# Patient Record
Sex: Female | Born: 1985 | Race: White | Hispanic: No | Marital: Married | Smoking: Never smoker
Health system: Southern US, Community
[De-identification: ages and names within clinical notes are randomized; demographics above are authoritative.]

## PROBLEM LIST (undated history)

## (undated) DIAGNOSIS — E0590 Thyrotoxicosis, unspecified without thyrotoxic crisis or storm: Secondary | ICD-10-CM

## (undated) HISTORY — PX: WISDOM TOOTH EXTRACTION: SHX21

---
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
## 2008-04-06 ENCOUNTER — Emergency Department (HOSPITAL_COMMUNITY): Admission: EM | Admit: 2008-04-06 | Discharge: 2008-04-06 | Payer: Self-pay | Admitting: Emergency Medicine

## 2011-08-29 LAB — BASIC METABOLIC PANEL
BUN: 8
CO2: 24
Calcium: 8.6
Chloride: 107
Creatinine, Ser: 0.7
GFR calc Af Amer: >60
GFR calc non Af Amer: >60
Glucose, Bld: 96
Potassium: 3.9
Sodium: 139

## 2011-08-29 LAB — CBC
HCT: 36.5
Hemoglobin: 12.3
MCHC: 33.6
MCV: 79.3
Platelets: 310
RBC: 4.61
RDW: 14.8
WBC: 14.5 — ABNORMAL HIGH

## 2011-08-29 LAB — DIFFERENTIAL
Basophils Absolute: 0
Basophils Relative: 0
Eosinophils Absolute: 0
Eosinophils Relative: 0
Lymphocytes Relative: 2 — ABNORMAL LOW
Lymphs Abs: 0.4 — ABNORMAL LOW
Monocytes Absolute: 0.3
Monocytes Relative: 2 — ABNORMAL LOW
Neutro Abs: 13.8 — ABNORMAL HIGH
Neutrophils Relative %: 95 — ABNORMAL HIGH

## 2011-08-29 LAB — URINE MICROSCOPIC-ADD ON

## 2011-08-29 LAB — URINALYSIS, ROUTINE W REFLEX MICROSCOPIC
Bilirubin Urine: NEGATIVE
Glucose, UA: NEGATIVE
Ketones, ur: NEGATIVE
Nitrite: NEGATIVE
Protein, ur: NEGATIVE
Specific Gravity, Urine: 1.014
Urobilinogen, UA: 0.2
pH: 6

## 2011-08-29 LAB — POCT PREGNANCY, URINE
Operator id: 288331
Preg Test, Ur: NEGATIVE

## 2014-03-07 LAB — OB RESULTS CONSOLE GC/CHLAMYDIA
CHLAMYDIA, DNA PROBE: NEGATIVE
GC PROBE AMP, GENITAL: NEGATIVE

## 2014-03-08 LAB — OB RESULTS CONSOLE HEPATITIS B SURFACE ANTIGEN: HEP B S AG: NEGATIVE

## 2014-03-08 LAB — OB RESULTS CONSOLE ABO/RH: RH Type: POSITIVE

## 2014-03-08 LAB — OB RESULTS CONSOLE HIV ANTIBODY (ROUTINE TESTING): HIV: NONREACTIVE

## 2014-03-08 LAB — OB RESULTS CONSOLE RUBELLA ANTIBODY, IGM: Rubella: NON-IMMUNE/NOT IMMUNE

## 2014-03-08 LAB — OB RESULTS CONSOLE ANTIBODY SCREEN: Antibody Screen: NEGATIVE

## 2014-03-08 LAB — OB RESULTS CONSOLE RPR: RPR: NONREACTIVE

## 2014-03-08 LAB — OB RESULTS CONSOLE GBS: STREP GROUP B AG: POSITIVE

## 2014-10-21 ENCOUNTER — Telehealth (HOSPITAL_COMMUNITY): Payer: Self-pay | Admitting: *Deleted

## 2014-10-21 ENCOUNTER — Encounter (HOSPITAL_COMMUNITY): Payer: Self-pay | Admitting: *Deleted

## 2014-10-21 NOTE — Telephone Encounter (Signed)
Preadmission screen  

## 2014-10-23 ENCOUNTER — Inpatient Hospital Stay (HOSPITAL_COMMUNITY)
Admission: AD | Admit: 2014-10-23 | Discharge: 2014-10-25 | DRG: 765 | Disposition: A | Discharge status: Home / Self Care | Payer: BC Managed Care – PPO | Source: Ambulatory Visit | Attending: Obstetrics and Gynecology | Admitting: Obstetrics and Gynecology

## 2014-10-23 ENCOUNTER — Encounter (HOSPITAL_COMMUNITY): Payer: Self-pay | Admitting: Anesthesiology

## 2014-10-23 ENCOUNTER — Inpatient Hospital Stay (HOSPITAL_COMMUNITY)
Admission: RE | Admit: 2014-10-23 | Discharge: 2014-10-23 | Disposition: A | Discharge status: Home / Self Care | Payer: BC Managed Care – PPO | Source: Ambulatory Visit | Attending: Obstetrics and Gynecology | Admitting: Obstetrics and Gynecology

## 2014-10-23 ENCOUNTER — Encounter (HOSPITAL_COMMUNITY): Payer: Self-pay | Admitting: *Deleted

## 2014-10-23 ENCOUNTER — Inpatient Hospital Stay (HOSPITAL_COMMUNITY): Payer: BC Managed Care – PPO | Admitting: Anesthesiology

## 2014-10-23 ENCOUNTER — Encounter (HOSPITAL_COMMUNITY)
Admission: AD | Disposition: A | Discharge status: Home / Self Care | Payer: Self-pay | Source: Ambulatory Visit | Attending: Obstetrics and Gynecology

## 2014-10-23 DIAGNOSIS — E0590 Thyrotoxicosis, unspecified without thyrotoxic crisis or storm: Secondary | ICD-10-CM | POA: Diagnosis present

## 2014-10-23 DIAGNOSIS — D62 Acute posthemorrhagic anemia: Secondary | ICD-10-CM | POA: Diagnosis not present

## 2014-10-23 DIAGNOSIS — Z880 Allergy status to penicillin: Secondary | ICD-10-CM | POA: Diagnosis not present

## 2014-10-23 DIAGNOSIS — O480 Post-term pregnancy: Secondary | ICD-10-CM | POA: Diagnosis present

## 2014-10-23 DIAGNOSIS — O99824 Streptococcus B carrier state complicating childbirth: Secondary | ICD-10-CM | POA: Diagnosis present

## 2014-10-23 DIAGNOSIS — O99284 Endocrine, nutritional and metabolic diseases complicating childbirth: Secondary | ICD-10-CM | POA: Diagnosis present

## 2014-10-23 DIAGNOSIS — Z3A41 41 weeks gestation of pregnancy: Secondary | ICD-10-CM | POA: Diagnosis present

## 2014-10-23 DIAGNOSIS — O9081 Anemia of the puerperium: Secondary | ICD-10-CM | POA: Diagnosis not present

## 2014-10-23 HISTORY — DX: Thyrotoxicosis, unspecified without thyrotoxic crisis or storm: E05.90

## 2014-10-23 LAB — CBC
HEMATOCRIT: 35.8 % — AB (ref 36.0–46.0)
HEMOGLOBIN: 12.2 g/dL (ref 12.0–15.0)
MCH: 28.4 pg (ref 26.0–34.0)
MCHC: 34.1 g/dL (ref 30.0–36.0)
MCV: 83.3 fL (ref 78.0–100.0)
Platelets: 242 10*3/uL (ref 150–400)
RBC: 4.3 MIL/uL (ref 3.87–5.11)
RDW: 14.4 % (ref 11.5–15.5)
WBC: 15.5 10*3/uL — ABNORMAL HIGH (ref 4.0–10.5)

## 2014-10-23 LAB — RPR

## 2014-10-23 LAB — PREPARE RBC (CROSSMATCH)

## 2014-10-23 LAB — ABO/RH: ABO/RH(D): O POS

## 2014-10-23 SURGERY — Surgical Case
Anesthesia: Epidural

## 2014-10-23 MED ORDER — LACTATED RINGERS IV BOLUS (SEPSIS)
1000.0000 mL | Freq: Once | INTRAVENOUS | Status: AC
  Administered 2014-10-23: 1000 mL via INTRAVENOUS

## 2014-10-23 MED ORDER — DIPHENHYDRAMINE HCL 50 MG/ML IJ SOLN: 12.5000 mg | INTRAMUSCULAR | Status: DC | PRN

## 2014-10-23 MED ORDER — OXYTOCIN 40 UNITS IN LACTATED RINGERS INFUSION - SIMPLE MED
62.5000 mL/h | INTRAVENOUS | Status: DC
Start: 2014-10-23 — End: 2014-10-23
  Filled 2014-10-23: qty 1000

## 2014-10-23 MED ORDER — MEPERIDINE HCL 25 MG/ML IJ SOLN: 6.2500 mg | INTRAMUSCULAR | Status: DC | PRN

## 2014-10-23 MED ORDER — SIMETHICONE 80 MG PO CHEW
80.0000 mg | CHEWABLE_TABLET | ORAL | Status: DC
  Administered 2014-10-24 (×2): 80 mg via ORAL
  Filled 2014-10-23 (×2): qty 1

## 2014-10-23 MED ORDER — LANOLIN HYDROUS EX OINT: 1.0000 "application " | TOPICAL_OINTMENT | CUTANEOUS | Status: DC | PRN

## 2014-10-23 MED ORDER — SENNOSIDES-DOCUSATE SODIUM 8.6-50 MG PO TABS
2.0000 | ORAL_TABLET | ORAL | Status: DC
  Administered 2014-10-24: 2 via ORAL
  Filled 2014-10-23 (×2): qty 2

## 2014-10-23 MED ORDER — NALOXONE HCL 0.4 MG/ML IJ SOLN: 0.4000 mg | INTRAMUSCULAR | Status: DC | PRN

## 2014-10-23 MED ORDER — EPHEDRINE 5 MG/ML INJ: 10.0000 mg | INTRAVENOUS | Status: DC | PRN

## 2014-10-23 MED ORDER — TERBUTALINE SULFATE 1 MG/ML IJ SOLN
INTRAMUSCULAR | Status: AC
  Administered 2014-10-23: 0.25 mg via SUBCUTANEOUS
  Filled 2014-10-23: qty 1

## 2014-10-23 MED ORDER — DEXTROSE 5 % IV SOLN
Freq: Once | INTRAVENOUS | Status: AC
  Administered 2014-10-23: 100 mL via INTRAVENOUS
  Filled 2014-10-23: qty 8.25

## 2014-10-23 MED ORDER — FENTANYL 2.5 MCG/ML BUPIVACAINE 1/10 % EPIDURAL INFUSION (WH - ANES)
INTRAMUSCULAR | Status: AC
  Filled 2014-10-23: qty 125

## 2014-10-23 MED ORDER — ZOLPIDEM TARTRATE 5 MG PO TABS: 5.0000 mg | ORAL_TABLET | Freq: Every evening | ORAL | Status: DC | PRN

## 2014-10-23 MED ORDER — PHENYLEPHRINE 40 MCG/ML (10ML) SYRINGE FOR IV PUSH (FOR BLOOD PRESSURE SUPPORT)
PREFILLED_SYRINGE | INTRAVENOUS | Status: AC
  Filled 2014-10-23: qty 10

## 2014-10-23 MED ORDER — DIPHENHYDRAMINE HCL 25 MG PO CAPS: 25.0000 mg | ORAL_CAPSULE | ORAL | Status: DC | PRN

## 2014-10-23 MED ORDER — IBUPROFEN 600 MG PO TABS
600.0000 mg | ORAL_TABLET | Freq: Four times a day (QID) | ORAL | Status: DC
  Administered 2014-10-24 – 2014-10-25 (×8): 600 mg via ORAL
  Filled 2014-10-23 (×8): qty 1

## 2014-10-23 MED ORDER — ONDANSETRON HCL 4 MG/2ML IJ SOLN
4.0000 mg | INTRAMUSCULAR | Status: DC | PRN
Start: 2014-10-23 — End: 2014-10-24

## 2014-10-23 MED ORDER — NALBUPHINE HCL 10 MG/ML IJ SOLN: 5.0000 mg | INTRAMUSCULAR | Status: DC | PRN

## 2014-10-23 MED ORDER — ONDANSETRON HCL 4 MG/2ML IJ SOLN
INTRAMUSCULAR | Status: AC
  Filled 2014-10-23: qty 2

## 2014-10-23 MED ORDER — METHYLERGONOVINE MALEATE 0.2 MG PO TABS: 0.2000 mg | ORAL_TABLET | ORAL | Status: DC | PRN

## 2014-10-23 MED ORDER — ONDANSETRON HCL 4 MG PO TABS: 4.0000 mg | ORAL_TABLET | ORAL | Status: DC | PRN

## 2014-10-23 MED ORDER — DEXTROSE IN LACTATED RINGERS 5 % IV SOLN: INTRAVENOUS | Status: DC

## 2014-10-23 MED ORDER — SODIUM BICARBONATE 8.4 % IV SOLN
INTRAVENOUS | Status: DC | PRN
  Administered 2014-10-23 (×3): 5 mL via EPIDURAL

## 2014-10-23 MED ORDER — SODIUM CHLORIDE 0.9 % IJ SOLN: 3.0000 mL | INTRAMUSCULAR | Status: DC | PRN

## 2014-10-23 MED ORDER — MORPHINE SULFATE (PF) 0.5 MG/ML IJ SOLN
INTRAMUSCULAR | Status: DC | PRN
  Administered 2014-10-23: 3 mg via EPIDURAL

## 2014-10-23 MED ORDER — KETOROLAC TROMETHAMINE 30 MG/ML IJ SOLN
30.0000 mg | Freq: Four times a day (QID) | INTRAMUSCULAR | Status: DC | PRN
  Administered 2014-10-23 (×2): 30 mg via INTRAVENOUS
  Filled 2014-10-23: qty 1

## 2014-10-23 MED ORDER — LACTATED RINGERS IV SOLN
INTRAVENOUS | Status: DC
  Administered 2014-10-23 (×3): via INTRAUTERINE

## 2014-10-23 MED ORDER — DIPHENHYDRAMINE HCL 25 MG PO CAPS: 25.0000 mg | ORAL_CAPSULE | Freq: Four times a day (QID) | ORAL | Status: DC | PRN

## 2014-10-23 MED ORDER — OXYTOCIN BOLUS FROM INFUSION
500.0000 mL | INTRAVENOUS | Status: DC
Start: 2014-10-23 — End: 2014-10-23

## 2014-10-23 MED ORDER — PHENYLEPHRINE 40 MCG/ML (10ML) SYRINGE FOR IV PUSH (FOR BLOOD PRESSURE SUPPORT): 80.0000 ug | PREFILLED_SYRINGE | INTRAVENOUS | Status: DC | PRN

## 2014-10-23 MED ORDER — TERBUTALINE SULFATE 1 MG/ML IJ SOLN
0.2500 mg | INTRAMUSCULAR | Status: AC
  Administered 2014-10-23: 0.25 mg via SUBCUTANEOUS

## 2014-10-23 MED ORDER — OXYCODONE-ACETAMINOPHEN 5-325 MG PO TABS: 2.0000 | ORAL_TABLET | ORAL | Status: DC | PRN

## 2014-10-23 MED ORDER — OXYTOCIN 40 UNITS IN LACTATED RINGERS INFUSION - SIMPLE MED: 62.5000 mL/h | INTRAVENOUS | Status: DC

## 2014-10-23 MED ORDER — PHENYLEPHRINE HCL 10 MG/ML IJ SOLN
INTRAMUSCULAR | Status: DC | PRN
  Administered 2014-10-23 (×8): 40 ug via INTRAVENOUS

## 2014-10-23 MED ORDER — FENTANYL 2.5 MCG/ML BUPIVACAINE 1/10 % EPIDURAL INFUSION (WH - ANES)
INTRAMUSCULAR | Status: DC | PRN
  Administered 2014-10-23: 14 mL/h via EPIDURAL

## 2014-10-23 MED ORDER — FLEET ENEMA 7-19 GM/118ML RE ENEM: 1.0000 | ENEMA | Freq: Every day | RECTAL | Status: DC | PRN

## 2014-10-23 MED ORDER — METHYLERGONOVINE MALEATE 0.2 MG/ML IJ SOLN: 0.2000 mg | INTRAMUSCULAR | Status: DC | PRN

## 2014-10-23 MED ORDER — SCOPOLAMINE 1 MG/3DAYS TD PT72
MEDICATED_PATCH | TRANSDERMAL | Status: AC
  Filled 2014-10-23: qty 1

## 2014-10-23 MED ORDER — BISACODYL 10 MG RE SUPP: 10.0000 mg | Freq: Every day | RECTAL | Status: DC | PRN

## 2014-10-23 MED ORDER — LIDOCAINE HCL (PF) 1 % IJ SOLN
30.0000 mL | INTRAMUSCULAR | Status: DC | PRN
  Filled 2014-10-23: qty 30

## 2014-10-23 MED ORDER — OXYTOCIN 10 UNIT/ML IJ SOLN
INTRAMUSCULAR | Status: AC
  Filled 2014-10-23: qty 4

## 2014-10-23 MED ORDER — OXYTOCIN 10 UNIT/ML IJ SOLN
40.0000 [IU] | INTRAVENOUS | Status: DC | PRN
  Administered 2014-10-23: 40 [IU] via INTRAVENOUS

## 2014-10-23 MED ORDER — BUPIVACAINE HCL (PF) 0.25 % IJ SOLN
INTRAMUSCULAR | Status: AC
  Filled 2014-10-23: qty 10

## 2014-10-23 MED ORDER — NALBUPHINE HCL 10 MG/ML IJ SOLN: 5.0000 mg | Freq: Once | INTRAMUSCULAR | Status: AC | PRN

## 2014-10-23 MED ORDER — FERROUS SULFATE 325 (65 FE) MG PO TABS
325.0000 mg | ORAL_TABLET | Freq: Two times a day (BID) | ORAL | Status: DC
  Administered 2014-10-23 – 2014-10-25 (×5): 325 mg via ORAL
  Filled 2014-10-23 (×5): qty 1

## 2014-10-23 MED ORDER — ONDANSETRON HCL 4 MG/2ML IJ SOLN: 4.0000 mg | Freq: Four times a day (QID) | INTRAMUSCULAR | Status: DC | PRN

## 2014-10-23 MED ORDER — SIMETHICONE 80 MG PO CHEW
80.0000 mg | CHEWABLE_TABLET | Freq: Three times a day (TID) | ORAL | Status: DC
  Administered 2014-10-23 – 2014-10-25 (×6): 80 mg via ORAL
  Filled 2014-10-23 (×7): qty 1

## 2014-10-23 MED ORDER — LACTATED RINGERS IV SOLN: 500.0000 mL | INTRAVENOUS | Status: DC | PRN

## 2014-10-23 MED ORDER — DEXTROSE 5 % IV SOLN
1.0000 ug/kg/h | INTRAVENOUS | Status: DC | PRN
  Filled 2014-10-23: qty 2

## 2014-10-23 MED ORDER — OXYTOCIN 10 UNIT/ML IJ SOLN
10.0000 [IU] | Freq: Once | INTRAMUSCULAR | Status: DC
  Filled 2014-10-23: qty 1

## 2014-10-23 MED ORDER — CLINDAMYCIN PHOSPHATE 900 MG/50ML IV SOLN
900.0000 mg | Freq: Three times a day (TID) | INTRAVENOUS | Status: DC
  Administered 2014-10-23: 900 mg via INTRAVENOUS
  Filled 2014-10-23 (×2): qty 50

## 2014-10-23 MED ORDER — WITCH HAZEL-GLYCERIN EX PADS: 1.0000 "application " | MEDICATED_PAD | CUTANEOUS | Status: DC | PRN

## 2014-10-23 MED ORDER — FENTANYL 2.5 MCG/ML BUPIVACAINE 1/10 % EPIDURAL INFUSION (WH - ANES)
14.0000 mL/h | INTRAMUSCULAR | Status: DC | PRN
  Administered 2014-10-23: 14 mL/h via EPIDURAL

## 2014-10-23 MED ORDER — BUPIVACAINE HCL (PF) 0.25 % IJ SOLN
INTRAMUSCULAR | Status: DC | PRN
  Administered 2014-10-23: 10 mL

## 2014-10-23 MED ORDER — OXYCODONE-ACETAMINOPHEN 5-325 MG PO TABS
1.0000 | ORAL_TABLET | ORAL | Status: DC | PRN
Start: 2014-10-23 — End: 2014-10-23

## 2014-10-23 MED ORDER — MORPHINE SULFATE 0.5 MG/ML IJ SOLN
INTRAMUSCULAR | Status: AC
  Filled 2014-10-23: qty 10

## 2014-10-23 MED ORDER — PRENATAL MULTIVITAMIN CH
1.0000 | ORAL_TABLET | Freq: Every day | ORAL | Status: DC
  Administered 2014-10-24 – 2014-10-25 (×2): 1 via ORAL
  Filled 2014-10-23 (×2): qty 1

## 2014-10-23 MED ORDER — KETOROLAC TROMETHAMINE 30 MG/ML IJ SOLN
INTRAMUSCULAR | Status: AC
  Administered 2014-10-23: 30 mg via INTRAVENOUS
  Filled 2014-10-23: qty 1

## 2014-10-23 MED ORDER — SODIUM CHLORIDE 0.9 % IV SOLN: 250.0000 mL | INTRAVENOUS | Status: DC

## 2014-10-23 MED ORDER — LIDOCAINE HCL (PF) 1 % IJ SOLN
INTRAMUSCULAR | Status: DC | PRN
  Administered 2014-10-23 (×2): 4 mL

## 2014-10-23 MED ORDER — LACTATED RINGERS IV SOLN
500.0000 mL | Freq: Once | INTRAVENOUS | Status: AC
  Administered 2014-10-23: 500 mL via INTRAVENOUS

## 2014-10-23 MED ORDER — MENTHOL 3 MG MT LOZG
1.0000 | LOZENGE | OROMUCOSAL | Status: DC | PRN
  Filled 2014-10-23: qty 9

## 2014-10-23 MED ORDER — SCOPOLAMINE 1 MG/3DAYS TD PT72
1.0000 | MEDICATED_PATCH | Freq: Once | TRANSDERMAL | Status: DC
  Administered 2014-10-23: 1.5 mg via TRANSDERMAL

## 2014-10-23 MED ORDER — SODIUM CHLORIDE 0.9 % IJ SOLN: 3.0000 mL | Freq: Two times a day (BID) | INTRAMUSCULAR | Status: DC

## 2014-10-23 MED ORDER — DIBUCAINE 1 % RE OINT: 1.0000 "application " | TOPICAL_OINTMENT | RECTAL | Status: DC | PRN

## 2014-10-23 MED ORDER — SIMETHICONE 80 MG PO CHEW: 80.0000 mg | CHEWABLE_TABLET | ORAL | Status: DC | PRN

## 2014-10-23 MED ORDER — ONDANSETRON HCL 4 MG/2ML IJ SOLN: 4.0000 mg | Freq: Three times a day (TID) | INTRAMUSCULAR | Status: DC | PRN

## 2014-10-23 MED ORDER — OXYCODONE-ACETAMINOPHEN 5-325 MG PO TABS: 1.0000 | ORAL_TABLET | ORAL | Status: DC | PRN

## 2014-10-23 MED ORDER — CITRIC ACID-SODIUM CITRATE 334-500 MG/5ML PO SOLN
30.0000 mL | ORAL | Status: DC | PRN
  Administered 2014-10-23: 30 mL via ORAL
  Filled 2014-10-23: qty 15

## 2014-10-23 MED ORDER — ONDANSETRON HCL 4 MG/2ML IJ SOLN
INTRAMUSCULAR | Status: DC | PRN
  Administered 2014-10-23: 4 mg via INTRAVENOUS

## 2014-10-23 MED ORDER — KETOROLAC TROMETHAMINE 30 MG/ML IJ SOLN: 30.0000 mg | Freq: Four times a day (QID) | INTRAMUSCULAR | Status: DC | PRN

## 2014-10-23 SURGICAL SUPPLY — 41 items
APL SKNCLS STERI-STRIP NONHPOA (GAUZE/BANDAGES/DRESSINGS)
BARRIER ADHS 3X4 INTERCEED (GAUZE/BANDAGES/DRESSINGS) ×2 IMPLANT
BENZOIN TINCTURE PRP APPL 2/3 (GAUZE/BANDAGES/DRESSINGS) IMPLANT
BRR ADH 4X3 ABS CNTRL BYND (GAUZE/BANDAGES/DRESSINGS) ×1
CLAMP CORD UMBIL (MISCELLANEOUS) IMPLANT
CLOTH BEACON ORANGE TIMEOUT ST (SAFETY) ×2 IMPLANT
CONTAINER PREFILL 10% NBF 15ML (MISCELLANEOUS) IMPLANT
DRAPE SHEET LG 3/4 BI-LAMINATE (DRAPES) IMPLANT
DRSG OPSITE POSTOP 4X10 (GAUZE/BANDAGES/DRESSINGS) ×2 IMPLANT
DURAPREP 26ML APPLICATOR (WOUND CARE) ×2 IMPLANT
ELECT REM PT RETURN 9FT ADLT (ELECTROSURGICAL) ×2
ELECTRODE REM PT RTRN 9FT ADLT (ELECTROSURGICAL) ×1 IMPLANT
EXTRACTOR VACUUM M CUP 4 TUBE (SUCTIONS) IMPLANT
GLOVE BIOGEL PI IND STRL 7.0 (GLOVE) ×1 IMPLANT
GLOVE BIOGEL PI INDICATOR 7.0 (GLOVE) ×1
GLOVE ECLIPSE 6.5 STRL STRAW (GLOVE) ×2 IMPLANT
GOWN STRL REUS W/TWL LRG LVL3 (GOWN DISPOSABLE) ×4 IMPLANT
KIT ABG SYR 3ML LUER SLIP (SYRINGE) IMPLANT
NEEDLE HYPO 25X1 1.5 SAFETY (NEEDLE) ×2 IMPLANT
NEEDLE HYPO 25X5/8 SAFETYGLIDE (NEEDLE) IMPLANT
NS IRRIG 1000ML POUR BTL (IV SOLUTION) ×2 IMPLANT
PACK C SECTION WH (CUSTOM PROCEDURE TRAY) ×2 IMPLANT
PAD OB MATERNITY 4.3X12.25 (PERSONAL CARE ITEMS) ×2 IMPLANT
RTRCTR C-SECT PINK 25CM LRG (MISCELLANEOUS) IMPLANT
STAPLER VISISTAT 35W (STAPLE) IMPLANT
STRIP CLOSURE SKIN 1/2X4 (GAUZE/BANDAGES/DRESSINGS) IMPLANT
SUT CHROMIC GUT AB #0 18 (SUTURE) IMPLANT
SUT MNCRL 0 VIOLET CTX 36 (SUTURE) ×3 IMPLANT
SUT MON AB 4-0 PS1 27 (SUTURE) IMPLANT
SUT MONOCRYL 0 CTX 36 (SUTURE) ×3
SUT PLAIN 2 0 (SUTURE)
SUT PLAIN 2 0 XLH (SUTURE) IMPLANT
SUT PLAIN ABS 2-0 CT1 27XMFL (SUTURE) IMPLANT
SUT VIC AB 0 CT1 36 (SUTURE) ×4 IMPLANT
SUT VIC AB 2-0 CT1 27 (SUTURE) ×2
SUT VIC AB 2-0 CT1 TAPERPNT 27 (SUTURE) ×1 IMPLANT
SUT VIC AB 4-0 PS2 27 (SUTURE) IMPLANT
SYR CONTROL 10ML LL (SYRINGE) ×2 IMPLANT
TOWEL OR 17X24 6PK STRL BLUE (TOWEL DISPOSABLE) ×2 IMPLANT
TRAY FOLEY CATH 14FR (SET/KITS/TRAYS/PACK) IMPLANT
WATER STERILE IRR 1000ML POUR (IV SOLUTION) ×2 IMPLANT

## 2014-10-23 NOTE — Progress Notes (Signed)
Persistent deep variables. Cervix unchanged. Unable to do pitocin Pt advised of need for C/S.  terbutaline ordered. OR who was on standby notified of need for urgent C/S.

## 2014-10-23 NOTE — Progress Notes (Signed)
Subjective:   Comfortable with epidural.  Objective:   VS: Blood pressure 123/71, pulse 80, temperature 97.8 F (36.6 C), temperature source Oral, resp. rate 18, height 5\' 2"  (1.575 m), weight 88.905 kg (196 lb), last menstrual period 01/05/2014. FHR: baseline 145 / variability mod / accelerations absent / variable decelerations Toco: contractions every 2-3 minutes Cervix: Dilation: 6.5 Effacement (%): 80 Station: -1 Exam by:: Fabian NovemberM. Lateisha Thurlow, CNM ROT Membranes: mod mec, good return from amnioinfusion  Assessment:  Labor: active FHR category II  Plan:  Initial improvement of variables after amnioinfusion, now with recurrent deep variables and transverse fetal vtx. Left lateral and peanut ball to facilitate rotation. Dr. Cherly Hensenousins at bedside. Likely need for CS.     Donette LarryBHAMBRI, Jericho Cieslik, N MSN, CNM 10/23/2014, 7:07 AM

## 2014-10-23 NOTE — Addendum Note (Signed)
Addendum  created 10/23/14 1040 by Corky Soxhris Shatiqua Heroux, MD   Modules edited: Orders, PRL Based Order Sets

## 2014-10-23 NOTE — Anesthesia Procedure Notes (Signed)
Epidural Patient location during procedure: OB  Staffing Anesthesiologist: Lewie LoronGERMEROTH, Chelisa Hennen R Performed by: anesthesiologist   Preanesthetic Checklist Completed: patient identified, pre-op evaluation, timeout performed, IV checked, risks and benefits discussed and monitors and equipment checked  Epidural Patient position: sitting Prep: site prepped and draped and DuraPrep Patient monitoring: heart rate Approach: midline Location: L3-L4 Injection technique: LOR air and LOR saline  Needle:  Needle type: Tuohy  Needle gauge: 17 G Needle length: 9 cm Needle insertion depth: 6 cm Catheter type: closed end flexible Catheter size: 19 Gauge Catheter at skin depth: 11 cm Test dose: negative  Assessment Sensory level: T8 Events: blood not aspirated, injection not painful, no injection resistance, negative IV test and no paresthesia  Additional Notes Reason for block:procedure for pain

## 2014-10-23 NOTE — Anesthesia Postprocedure Evaluation (Signed)
  Anesthesia Post-op Note  Patient: Sara Wright C Mangini  Procedure(s) Performed: Procedure(s): CESAREAN SECTION (N/A)  Patient Location: Mother/Baby  Anesthesia Type:Epidural  Level of Consciousness: awake  Airway and Oxygen Therapy: Patient Spontanous Breathing  Post-op Pain: mild  Post-op Assessment: Patient's Cardiovascular Status Stable and Respiratory Function Stable  Post-op Vital Signs: stable  Last Vitals:  Filed Vitals:   10/23/14 1438  BP: 121/66  Pulse: 92  Temp: 37 C  Resp: 20    Complications: No apparent anesthesia complications

## 2014-10-23 NOTE — Op Note (Deleted)
NAME:  Sara Wright, [name]                 ACCOUNT NO.:  [id number]  MEDICAL RECORD NO.:  [id number]  LOCATION:  [facility]                        FACILITY:  [facility]  PHYSICIAN:  Maxie BetterSheronette Azlynn Mitnick, M.D.DATE OF BIRTH:  1986/10/05  DATE OF PROCEDURE:  10/23/2014 DATE OF DISCHARGE:                              OPERATIVE REPORT    PREOPERATIVE DIAGNOSIS:  Repetitive deep variable deceleration, postdates.  POSTOPERATIVE DIAGNOSIS:  Repetitive deep variable decelerations, postdates.  PROCEDURE:  Urgent primary cesarean section, Kerr hysterotomy.  ANESTHESIA:  Epidural.  SURGEON:  [name], MD  ASSISTANT:  Donette LarryMelanie Bhambri, CNM  DESCRIPTION OF PROCEDURE:  Under adequate epidural anesthesia, the patient was placed in the supine position with left lateral tilt.  She was sterilely prepped and draped in usual fashion.  Indwelling Foley catheter was already in place.  0.25% Marcaine was injected along the planned Pfannenstiel skin incision site.  Pfannenstiel skin incision was then made, carried down to the rectus fascia.  Rectus fascia was opened transversely.  Rectus fascia was then bluntly and sharply dissected off the rectus muscle in superior and inferior fashion.  The rectus muscle was split in midline.  The parietal peritoneum was entered bluntly and extended.  The Alexis retractor was then placed.  The vesicouterine peritoneum was opened transversely.  The bladder was then bluntly and sharply dissected off the lower uterine segment and displaced inferiorly with a bladder retractor.  A curvilinear low transverse uterine incision was then made and extended with bandage scissors.  Subsequent delivery of a live female with cord around the neck was delivered from the left occiput transverse position.  Baby was bulb suctioned in the abdomen. Cord was reduced.  There was also cord on the left arm.  The cord was then clamped and cut.  The baby was transferred to the awaiting pediatrician  who assigned Apgars of 8 and 9 at 1 and 5 minutes.  The placenta was manually removed and sent to Pathology.  Uterine cavity was cleaned of debris.  Uterine incision had no extension, was closed in 2 layers, the first layer with 0 Monocryl running locked stitch and second layer was imbricated using 0 Monocryl suture.  Good hemostasis was achieved.  Normal tubes and ovaries were noted bilaterally.  The abdomen was irrigated and suctioned.  The Alexis retractor removed.  Interceed was then placed in the lower uterine segment.  The parietal peritoneum was then closed with 2-0 Vicryl.  The rectus fascia was closed with 0 Vicryl x2.  The subcutaneous area was irrigated, small bleeders cauterized.  Interrupted 2-0 plain sutures placed and the skin approximated using Ethicon staples.  Specimen was placenta sent to Pathology.  Cord pH was obtained.  ESTIMATED BLOOD LOSS:  500 mL.  INTRAOPERATIVE FLUID:  2300 mL.  URINE OUTPUT:  100 mL clear yellow urine.  COUNTS:  Sponge and instrument counts x2 were correct.  COMPLICATION:  None.  Weight of the baby was 8 pounds 8 ounces.  The baby was placed skin-to- skin in the operating room.  The patient was transferred to recovery in stable condition.     [name], M.D.     [place]/MEDQ  D:  10/23/2014  T:  10/23/2014  Job:  [id number]

## 2014-10-23 NOTE — Transfer of Care (Signed)
Immediate Anesthesia Transfer of Care Note  Patient: Sara Wright  Procedure(s) Performed: Procedure(s): CESAREAN SECTION (N/A)  Patient Location: PACU  Anesthesia Type:Epidural  Level of Consciousness: awake, alert  and oriented  Airway & Oxygen Therapy: Patient Spontanous Breathing  Post-op Assessment: Report given to PACU RN and Post -op Vital signs reviewed and stable  Post vital signs: Reviewed and stable  Complications: No apparent anesthesia complications

## 2014-10-23 NOTE — Plan of Care (Signed)
Problem: Phase I Progression Outcomes Goal: Foley catheter patent Outcome: Completed/Met Date Met:  10/23/14

## 2014-10-23 NOTE — Progress Notes (Signed)
Subjective:   Breathing with ctx, left lateral position. Ready for epidural.  Objective:   VS: Blood pressure 123/71, pulse 80, temperature 97.8 F (36.6 C), temperature source Oral, resp. rate 18, height 5\' 2"  (1.575 m), weight 88.905 kg (196 lb), last menstrual period 01/05/2014. FHR: baseline 130 / variability mod / accelerations absent / variable decelerations-recurrent, nadir to 80-90s Toco: contractions every 2-5 minutes Cervix: Dilation: 6.5 Effacement (%): 80 Station: -1 Exam by:: Fabian NovemberM. Coti Burd, CNM Membranes: BBOW  Assessment:  Labor: active FHR category II  Plan:  Variables non-responsive to repositioning and o2. Plan AROM, start amnioinfusion, guarded for SVD. Dr. Cherly Hensenousins updated, enroute to hospital.     Donette LarryBHAMBRI, Devynn Scheff, N MSN, CNM 10/23/2014, 6:29 AM

## 2014-10-23 NOTE — Anesthesia Preprocedure Evaluation (Signed)
Anesthesia Evaluation  Patient identified by MRN, date of birth, ID band Patient awake    Reviewed: Allergy & Precautions, H&P , NPO status , Patient's Chart, lab work & pertinent test results  Airway Mallampati: II  TM Distance: >3 FB Neck ROM: Full    Dental no notable dental hx.    Pulmonary neg pulmonary ROS,  breath sounds clear to auscultation  Pulmonary exam normal       Cardiovascular negative cardio ROS  Rhythm:Regular Rate:Normal     Neuro/Psych negative neurological ROS  negative psych ROS   GI/Hepatic negative GI ROS, Neg liver ROS,   Endo/Other  Hyperthyroidism   Renal/GU negative Renal ROS     Musculoskeletal negative musculoskeletal ROS (+)   Abdominal (+) + obese,   Peds  Hematology negative hematology ROS (+)   Anesthesia Other Findings   Reproductive/Obstetrics (+) Pregnancy                             Anesthesia Physical Anesthesia Plan  ASA: II  Anesthesia Plan: Epidural   Post-op Pain Management:    Induction:   Airway Management Planned:   Additional Equipment:   Intra-op Plan:   Post-operative Plan:   Informed Consent: I have reviewed the patients History and Physical, chart, labs and discussed the procedure including the risks, benefits and alternatives for the proposed anesthesia with the patient or authorized representative who has indicated his/her understanding and acceptance.     Plan Discussed with:   Anesthesia Plan Comments:         Anesthesia Quick Evaluation

## 2014-10-23 NOTE — Progress Notes (Addendum)
Called by CNM  For  Deep variable deceleration with  Each ctx  Instructed to Arom, IUPC with  Amnioinfusion and get epidural  ON arrival, Pt was in the process of getting epidural VE: per CNM 6 cm/80 /-1 ROT AROM was done: mod meconium fluid Amnioinfusion ongoing  Tracing reviewed: baseline 130's (+) mod to deep variables with some improvement after infusion Bolus. Ctx q 2-743mins  IMP: active phase with cord compression Postdates P) exaggerated sims position with peanut. Allow for amnioinfusion. Consented for poss C/S( risk reviewed and consent signed). ISE placement. Reviewed management with pt and husband . Agree with plan. OR notified to be on standby for  Possible C/S

## 2014-10-23 NOTE — Plan of Care (Signed)
Problem: Phase I Progression Outcomes Goal: Assess per MD/Nurse,Routine-VS,FHR,UC,Head to Toe assess Outcome: Completed/Met Date Met:  10/23/14 Goal: Obtain and review prenatal records Outcome: Completed/Met Date Met:  10/23/14 Goal: Pain controlled with appropriate interventions Outcome: Completed/Met Date Met:  10/23/14 Goal: OOB as tolerated unless otherwise ordered Outcome: Completed/Met Date Met:  10/23/14 Goal: Tolerating diet Outcome: Completed/Met Date Met:  10/23/14 Goal: Adequate progression of labor Outcome: Completed/Met Date Met:  10/23/14 Goal: Medications/IV Fluids N/A Outcome: Completed/Met Date Met:  10/23/14 Goal: Induction meds as ordered Outcome: Not Applicable Date Met:  56/25/63 Goal: IV Pain medications as ordered Outcome: Not Applicable Date Met:  89/37/34 Goal: Pitocin as ordered Outcome: Not Applicable Date Met:  28/76/81 Goal: FHR checked 5 minutes after meds (ROM) Rupture of Membranes Outcome: Completed/Met Date Met:  10/23/14 Goal: Assess/evaluate labor progress and adequacy Outcome: Completed/Met Date Met:  10/23/14 Goal: Assess/evaluate cervical exam prn (q2hrs in active phase) Outcome: Completed/Met Date Met:  10/23/14 Goal: Assess/evaluate notify MD when complete/8 cm Outcome: Not Applicable Date Met:  15/72/62 Fetal intolerance to labor:urgent c-section. Goal: Assess/evaluate effectiveness of pushing Outcome: Not Applicable Date Met:  03/55/97 Fetal intolerance;urgent c-section. Goal: Complete instrument count Outcome: Not Applicable Date Met:  41/63/84 Goal: Count of instrument items (Specify in a note) Outcome: Not Applicable Date Met:  53/64/68 Goal: Discharge home if all goals are met Outcome: Not Applicable Date Met:  02/20/21 Goal: Appropriate patient level of comfort Outcome: Not Applicable Date Met:  48/25/00 Goal: Medical plan of care initiated within 2 hrs of admission Outcome: Completed/Met Date Met:  10/23/14 Goal: Other Phase I  Outcomes/Goals Outcome: Completed/Met Date Met:  10/23/14  Problem: Phase II Progression Outcomes Goal: Fetal monitoring per orders Outcome: Completed/Met Date Met:  10/23/14 Goal: Key steps for effective pushing & educate pt/family Fetal intolerance :urgetn c-section.

## 2014-10-23 NOTE — Progress Notes (Signed)
ANTIBIOTIC CONSULT NOTE - INITIAL  Pharmacy Consult for Gentamicin Indication: Surgical prophylaxis  Allergies  Allergen Reactions  . Penicillins     Patient Measurements: Height: 5\' 2"  (157.5 cm) Weight: 196 lb (88.905 kg) IBW/kg (Calculated) : 50.1 Adjusted Body Weight: 66 kg  Vital Signs: Temp: 97.8 F (36.6 C) (11/21 0530) Temp Source: Oral (11/21 0530) BP: 111/63 mmHg (11/21 0707) Pulse Rate: 98 (11/21 0707) Intake/Output from previous day:   Intake/Output from this shift:    Labs:  Recent Labs  10/23/14 0530  WBC 15.5*  HGB 12.2  PLT 242   Estimated Creatinine Clearance: 108.4 mL/min (by C-G formula based on Cr of 0.7). No results for input(s): VANCOTROUGH, VANCOPEAK, VANCORANDOM, GENTTROUGH, GENTPEAK, GENTRANDOM, TOBRATROUGH, TOBRAPEAK, TOBRARND, AMIKACINPEAK, AMIKACINTROU, AMIKACIN in the last 72 hours.   Microbiology: No results found for this or any previous visit (from the past 720 hour(s)).  Medical History: Past Medical History  Diagnosis Date  . Hyperthyroidism     Labs WNL during pregnancy; no meds    Medications:   Assessment: 28 yo at term with orders for gentamicin and clindamycin for emergent C-section.  Goal of Therapy:  Appropriate surgical prophylaxis in pt allergic to penicillin  Plan:  Gentamicin 5 mg/kg adjusted body weight   Arelia SneddonMason, Joniel Graumann Anne 10/23/2014,7:50 AM

## 2014-10-23 NOTE — Brief Op Note (Signed)
10/23/2014  9:04 AM  PATIENT:  Augusto GambleErin C Dhawan  28 y.o. female  PRE-OPERATIVE DIAGNOSIS:   Repetitive Deep variable deceleration, postdates  POST-OPERATIVE DIAGNOSIS: Repetitive deep variable deceleration, postdates  PROCEDURE:  Urgent Primary low cesarean section, kerr hysterotomy  SURGEON:  Surgeon(s) and Role:    * Talbert Trembath Cathie BeamsA Aruna Nestler, MD - Primary  PHYSICIAN ASSISTANT:   ASSISTANTS: Donette LarryMelanie Bhambri, CNM   ANESTHESIA:   epidural FINDINGS: Live female LOT nl tubes and ovaries, canx 1, left arm, placenta to path. Cord ph pending Apgars 8/9  8lb 8 oz EBL:  Total I/O In: 3000 [I.V.:2900; IV Piggyback:100] Out: 820 [Urine:320; Blood:500]  BLOOD ADMINISTERED:none  DRAINS: none   LOCAL MEDICATIONS USED:  MARCAINE     SPECIMEN:  Source of Specimen:  placenta  DISPOSITION OF SPECIMEN:  PATHOLOGY  COUNTS:  YES  TOURNIQUET:  * No tourniquets in log *  DICTATION: .Other Dictation: Dictation Number [id number]  PLAN OF CARE: Admit to inpatient   PATIENT DISPOSITION:  PACU - hemodynamically stable.   Delay start of Pharmacological VTE agent (>24hrs) due to surgical blood loss or risk of bleeding: no

## 2014-10-23 NOTE — Anesthesia Preprocedure Evaluation (Signed)
Anesthesia Evaluation  Patient identified by MRN, date of birth, ID band Patient awake    Reviewed: Allergy & Precautions, H&P , NPO status , Patient's Chart, lab work & pertinent test results  Airway Mallampati: II  TM Distance: >3 FB Neck ROM: Full    Dental no notable dental hx.    Pulmonary neg pulmonary ROS,  breath sounds clear to auscultation  Pulmonary exam normal       Cardiovascular negative cardio ROS  Rhythm:Regular Rate:Normal     Neuro/Psych negative neurological ROS  negative psych ROS   GI/Hepatic negative GI ROS, Neg liver ROS,   Endo/Other  Hyperthyroidism   Renal/GU negative Renal ROS     Musculoskeletal negative musculoskeletal ROS (+)   Abdominal (+) + obese,   Peds  Hematology negative hematology ROS (+)   Anesthesia Other Findings   Reproductive/Obstetrics (+) Pregnancy                             Anesthesia Physical  Anesthesia Plan  ASA: II and emergent  Anesthesia Plan: Epidural   Post-op Pain Management:    Induction:   Airway Management Planned:   Additional Equipment: None  Intra-op Plan:   Post-operative Plan:   Informed Consent: I have reviewed the patients History and Physical, chart, labs and discussed the procedure including the risks, benefits and alternatives for the proposed anesthesia with the patient or authorized representative who has indicated his/her understanding and acceptance.   Dental advisory given  Plan Discussed with: CRNA  Anesthesia Plan Comments:         Anesthesia Quick Evaluation

## 2014-10-23 NOTE — H&P (Signed)
  OB ADMISSION/ HISTORY & PHYSICAL:  Admission Date: 10/23/2014  5:03 AM  Admit Diagnosis: post dates pregnancy  Sara Wright is a 28 y.o. female presenting for active labor.  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  Prenatal Labs: ABO, Rh: A (04/06 0000)  Antibody: Negative (04/06 0000) Rubella: Nonimmune (04/06 0000)  RPR: Nonreactive (04/06 0000)  HBsAg: Negative (04/06 0000)  HIV: Non-reactive (04/06 0000)  GBS: Positive (04/06 0000)  1 hr GTT : 125  Medical / Surgical History :  Past medical history: No past medical history on file.   Past surgical history:  Past Surgical History  Procedure Laterality Date  . Wisdom tooth extraction      Family History:  Family History  Problem Relation Age of Onset  . Heart attack Father   . Heart disease Paternal Uncle   . Hypertension Maternal Grandfather   . Cancer Maternal Grandfather     lung  . Heart disease Paternal Grandfather      Social History:  has no tobacco, alcohol, and drug history on file.  Allergies: Penicillins   Current Medications at time of admission:  Prior to Admission medications   Not on File    Review of Systems: +FM +ctx +bloody show No LOF  Physical Exam:  VS: Last menstrual period 01/05/2014.  General: alert and oriented, appears mildly uncomfortable Heart: RRR Lungs: Clear lung fields Abdomen: Gravid, soft and non-tender, non-distended / uterus: gravid, non-tender Extremities: No edema Genitalia / VE:   6.5/80/-1, BBOW, vtx/ EFW: 8.5-9 lbs FHR: baseline rate 135 / variability mod / accelerations no / variable decelerations TOCO: 3-5, moderate  Assessment: 41.[redacted] weeks gestation First stage of labor, active FHR category II GBS carrier  Plan:  Admit, continuous  EFM, GBS prophylaxis, analgesia/anesthesia prn, reposition into maternal lateral to improve cord compression. Anticipate SVD. Dr. Cherly Hensenousins notified of admission / plan of care   Lawernce PittsBHAMBRI, Yuki Purves, N MSN, CNM 10/23/2014, 5:27 AM

## 2014-10-23 NOTE — Op Note (Signed)
NAME:  [name], Sara                 ACCOUNT NO.:  [id number]  MEDICAL RECORD NO.:  [id number]  LOCATION:  [facility]                        FACILITY:  [facility]  PHYSICIAN:  Maxie BetterSheronette Sabino Denning, M.D.DATE OF BIRTH:  15-Apr-1986  DATE OF PROCEDURE:  10/23/2014 DATE OF DISCHARGE:                              OPERATIVE REPORT     PREOPERATIVE DIAGNOSIS:  Repetitive deep variable deceleration, postdates.  POSTOPERATIVE DIAGNOSIS:  Repetitive deep variable decelerations, postdates.  PROCEDURE:  Urgent primary cesarean section, Kerr hysterotomy.  ANESTHESIA:  Epidural.  SURGEON:  Serita KyleSheronette A Teon Hudnall, MD  ASSISTANT:  Donette LarryMelanie Bhambri, CNM  DESCRIPTION OF PROCEDURE:  Under adequate epidural anesthesia, the patient was placed in the supine position with left lateral tilt.  She was sterilely prepped and draped in usual fashion.  Indwelling Foley catheter was already in place.  0.25% Marcaine was injected along the planned Pfannenstiel skin incision site.  Pfannenstiel skin incision was then made, carried down to the rectus fascia.  Rectus fascia was opened transversely.  Rectus fascia was then bluntly and sharply dissected off the rectus muscle in superior and inferior fashion.  The rectus muscle was split in midline.  The parietal peritoneum was entered bluntly and extended.  The Alexis retractor was then placed.  The vesicouterine peritoneum was opened transversely.  The bladder was then bluntly and sharply dissected off the lower uterine segment and displaced inferiorly with a bladder retractor.  A curvilinear low transverse uterine incision was then made and extended with bandage scissors.  Subsequent delivery of a live female with cord around the neck was delivered from the left occiput transverse position.  Baby was bulb suctioned in the abdomen. Cord was reduced.  There was also cord on the left arm.  The cord was then clamped and cut.  The baby was transferred to the awaiting pediatrician  who assigned Apgars of 8 and 9 at 1 and 5 minutes.  The placenta was manually removed and sent to Pathology.  Uterine cavity was cleaned of debris.  Uterine incision had no extension, was closed in 2 layers, the first layer with 0 Monocryl running locked stitch and second layer was imbricated using 0 Monocryl suture.  Good hemostasis was achieved.  Normal tubes and ovaries were noted bilaterally.  The abdomen was irrigated and suctioned.  The Alexis retractor removed.  Interceed was then placed in the lower uterine segment.  The parietal peritoneum was then closed with 2-0 Vicryl.  The rectus fascia was closed with 0 Vicryl x2.  The subcutaneous area was irrigated, small bleeders cauterized.  Interrupted 2-0 plain sutures placed and the skin approximated using Ethicon staples.  Specimen was placenta sent to Pathology.  Cord pH was obtained.  ESTIMATED BLOOD LOSS:  500 mL.  INTRAOPERATIVE FLUID:  2300 mL.  URINE OUTPUT:  100 mL clear yellow urine.  COUNTS:  Sponge and instrument counts x2 were correct.  COMPLICATION:  None.  Weight of the baby was 8 pounds 8 ounces.  The baby was placed skin-to- skin in the operating room.  The patient was transferred to recovery in stable condition.     Maxie BetterSheronette Ascension Stfleur, M.D.     [place]/MEDQ  D:  10/23/2014  T:  10/23/2014  Job:  [id number]

## 2014-10-23 NOTE — Plan of Care (Signed)
Problem: Discharge Progression Outcomes Goal: Elimination assessed prior to transfer Outcome: Progressing Pt transferred to PACU.

## 2014-10-23 NOTE — Addendum Note (Signed)
Addendum  created 10/23/14 1536 by Renford DillsJanet L Takari Duncombe, CRNA   Modules edited: Notes Section   Notes Section:  File: [id number]

## 2014-10-23 NOTE — Anesthesia Postprocedure Evaluation (Signed)
Anesthesia Post Note  Patient: Sara Wright C Kassner  Procedure(s) Performed: Procedure(s) (LRB): CESAREAN SECTION (N/A)  Anesthesia type: Epidural  Patient location: PACU  Post pain: Pain level controlled  Post assessment: Post-op Vital signs reviewed  Last Vitals: BP 98/40 mmHg  Pulse 107  Temp(Src) 36.3 C (Oral)  Resp 18  Ht 5\' 2"  (1.575 m)  Wt 196 lb (88.905 kg)  BMI 35.84 kg/m2  SpO2 99%  LMP 01/05/2014  Breastfeeding? Unknown  Post vital signs: Reviewed  Level of consciousness: sedated  Complications: No apparent anesthesia complications

## 2014-10-24 DIAGNOSIS — D62 Acute posthemorrhagic anemia: Secondary | ICD-10-CM | POA: Diagnosis not present

## 2014-10-24 LAB — CBC
HCT: 28.8 % — ABNORMAL LOW (ref 36.0–46.0)
Hemoglobin: 9.6 g/dL — ABNORMAL LOW (ref 12.0–15.0)
MCH: 28.4 pg (ref 26.0–34.0)
MCHC: 33.7 g/dL (ref 30.0–36.0)
MCV: 84.2 fL (ref 78.0–100.0)
PLATELETS: 193 10*3/uL (ref 150–400)
RBC: 3.42 MIL/uL — ABNORMAL LOW (ref 3.87–5.11)
RDW: 14.5 % (ref 11.5–15.5)
WBC: 13.7 10*3/uL — AB (ref 4.0–10.5)

## 2014-10-24 MED ORDER — MEASLES, MUMPS & RUBELLA VAC ~~LOC~~ INJ
0.5000 mL | INJECTION | Freq: Once | SUBCUTANEOUS | Status: AC
  Administered 2014-10-25: 0.5 mL via SUBCUTANEOUS
  Filled 2014-10-24: qty 0.5

## 2014-10-24 MED ORDER — MAGNESIUM OXIDE 400 (241.3 MG) MG PO TABS
200.0000 mg | ORAL_TABLET | Freq: Two times a day (BID) | ORAL | Status: DC
  Administered 2014-10-25: 200 mg via ORAL
  Filled 2014-10-24 (×5): qty 0.5

## 2014-10-24 NOTE — Plan of Care (Signed)
Problem: Phase I Progression Outcomes Goal: Pain controlled with appropriate interventions Outcome: Completed/Met Date Met:  10/24/14 Goal: Voiding adequately Outcome: Completed/Met Date Met:  10/24/14 Goal: OOB as tolerated unless otherwise ordered Outcome: Completed/Met Date Met:  10/24/14 Goal: IS, TCDB as ordered Outcome: Completed/Met Date Met:  10/24/14 Goal: VS, stable, temp < 100.4 degrees F Outcome: Completed/Met Date Met:  10/24/14 Goal: Initial discharge plan identified Outcome: Completed/Met Date Met:  10/24/14 Goal: Other Phase I Outcomes/Goals Outcome: Not Applicable Date Met:  62/22/97  Problem: Phase II Progression Outcomes Goal: Pain controlled on oral analgesia Outcome: Completed/Met Date Met:  10/24/14 Goal: Progress activity as tolerated unless otherwise ordered Outcome: Completed/Met Date Met:  10/24/14 Goal: Afebrile, VS remain stable Outcome: Completed/Met Date Met:  10/24/14 Goal: Tolerating diet Outcome: Completed/Met Date Met:  10/24/14

## 2014-10-24 NOTE — Plan of Care (Signed)
Problem: Consults Goal: Postpartum Patient Education (See Patient Education module for education specifics.)  Outcome: Completed/Met Date Met:  10/24/14     

## 2014-10-24 NOTE — Plan of Care (Signed)
Problem: Phase II Progression Outcomes Goal: Incision intact & without signs/symptoms of infection Outcome: Completed/Met Date Met:  10/24/14 Goal: Other Phase II Outcomes/Goals Outcome: Completed/Met Date Met:  10/24/14

## 2014-10-24 NOTE — Plan of Care (Signed)
Problem: Discharge Progression Outcomes Goal: Tolerating diet Outcome: Completed/Met Date Met:  10/24/14     

## 2014-10-24 NOTE — Progress Notes (Signed)
POSTOPERATIVE DAY # 1 S/P CS for non-reassuring FHR  S:         Reports feeling well - no pain             Tolerating po intake / no nausea / no vomiting / + flatus / no BM             Bleeding is light             Pain controlled with motrin and percocet             Up ad lib / ambulatory/ voiding QS  Newborn breast-feeding /elevated bili level - initiated on bili-lights / circumcision planned but delayed w/jaundice   O:  VS: BP 107/50 mmHg  Pulse 70  Temp(Src) 98.3 F (36.8 C) (Oral)  Resp 20  Ht '5\' 2"'  (1.575 m)  Wt 88.905 kg (196 lb)  BMI 35.84 kg/m2  SpO2 98%  LMP 01/05/2014  Breastfeeding? Unknown   LABS:               Recent Labs  10/23/14 0530 10/24/14 0529  WBC 15.5* 13.7*  HGB 12.2 9.6*  PLT 242 193               Bloodtype: --/--/O POS, O POS (11/21 0530)  Rubella: Nonimmune (04/06 0000)  - MMR prior to DC             tdaP and Flu given antenatal at [facility]                                        I&O: + 1884              Physical Exam:             Alert and Oriented X3  Lungs: Clear and unlabored  Heart: regular rate and rhythm / no mumurs  Abdomen: soft, non-tender, non-distended             Fundus: firm, non-tender, Ueven             Dressing intact              Incision:  approximated with staples / no erythema / no ecchymosis / no drainage  Extremities: trace edema, no calf pain or tenderness  A:        POD # 1 S/P CS for non-reassuring FHR            ABL anemia  P:        Routine postoperative care              Advance post-op care             Interested in early DC if baby cleared at 48hr tomorrow for DC             Encouraged increased feedings today   Artelia Laroche CNM, MSN, [facility] 10/24/2014, 11:16 AM

## 2014-10-24 NOTE — Lactation Note (Signed)
This note was copied from the chart of Boy Reggie Pilerin [place]. Lactation Consultation Note: Initial visit with mom. She reports that baby just finished feeding for 15 minutes. Baby continues under phototherapy. Mom reports that baby has been nursing well but her nipples are a little tender. Nipples look pink but intact. Comfort gels given with instructions Mom reports that breasts are feeling a little fuller this morning.  BF brochure given with resources for support after DC. No questions at present. To call prn  Patient Name: Boy Reggie Pilerin Ashworth ZOXWR'UToday's Date: 10/24/2014 Reason for consult: Initial assessment   Maternal Data Formula Feeding for Exclusion: No Has patient been taught Hand Expression?: Yes Does the patient have breastfeeding experience prior to this delivery?: No  Feeding Feeding Type: Breast Fed Length of feed: 15 min  LATCH Score/Interventions                      Lactation Tools Discussed/Used     Consult Status Consult Status: Follow-up Date: 10/25/14 Follow-up type: In-patient    Pamelia HoitWeeks, Korynn Kenedy D 10/24/2014, 3:45 PM

## 2014-10-24 NOTE — Plan of Care (Signed)
Problem: Discharge Progression Outcomes Goal: Activity appropriate for discharge plan Outcome: Completed/Met Date Met:  10/24/14 Goal: Pain controlled with appropriate interventions Outcome: Completed/Met Date Met:  10/24/14

## 2014-10-25 ENCOUNTER — Encounter (HOSPITAL_COMMUNITY): Payer: Self-pay | Admitting: Obstetrics and Gynecology

## 2014-10-25 MED ORDER — IBUPROFEN 600 MG PO TABS: 600.0000 mg | ORAL_TABLET | Freq: Four times a day (QID) | ORAL | Status: AC

## 2014-10-25 MED ORDER — OXYCODONE-ACETAMINOPHEN 5-325 MG PO TABS: 1.0000 | ORAL_TABLET | ORAL | Status: AC | PRN

## 2014-10-25 NOTE — Plan of Care (Signed)
Problem: Discharge Progression Outcomes Goal: Other Discharge Outcomes/Goals Outcome: Not Applicable Date Met:  10/25/14     

## 2014-10-25 NOTE — Plan of Care (Signed)
Problem: Discharge Progression Outcomes Goal: Remove staples per MD order Outcome: Not Applicable Date Met:  39/53/20 Will have staples removed in MD office

## 2014-10-25 NOTE — Plan of Care (Signed)
Problem: Discharge Progression Outcomes Goal: MMR given as ordered Outcome: Completed/Met Date Met:  10/25/14

## 2014-10-25 NOTE — Discharge Instructions (Signed)
Breast Pumping Tips °If you are breastfeeding, there may be times when you cannot feed your baby directly. Returning to work or going on a trip are common examples. Pumping allows you to store breast milk and feed it to your baby later.  °You may not get much milk when you first start to pump. Your breasts should start to make more after a few days. If you pump at the times you usually feed your baby, you may be able to keep making enough milk to feed your baby without also using formula. The more often you pump, the more milk you will produce.  °WHEN SHOULD I PUMP?  °· You can begin to pump soon after delivery. However, some experts recommend waiting about 4 weeks before giving your infant a bottle to make sure breastfeeding is going well.  °· If you plan to return to work, begin pumping a few weeks before. This will help you develop techniques that work best for you. It also lets you build up a supply of breast milk.   °· When you are with your infant, feed on demand and pump after each feeding.   °· When you are away from your infant for several hours, pump for about 15 minutes every 2-3 hours. Pump both breasts at the same time if you can.   °· If your infant has a formula feeding, make sure to pump around the same time.     °· If you drink any alcohol, wait 2 hours before pumping.   °HOW DO I PREPARE TO PUMP? °Your let-down reflex is the natural reaction to stimulation that makes your breast milk flow. It is easier to stimulate this reflex when you are relaxed. Find relaxation techniques that work for you. If you have difficulty with your let-down reflex, try these methods:  °· Smell one of your infant's blankets or an item of clothing.   °· Look at a picture or video of your infant.   °· Sit in a quiet, private space.   °· Massage the breast you plan to pump.   °· Place soothing warmth on the breast.   °· Play relaxing music.   °WHAT ARE SOME GENERAL BREAST PUMPING TIPS? °· Wash your hands before you pump. You  do not need to wash your nipples or breasts. °· There are three ways to pump. °¨ You can use your hand to massage and compress your breast. °¨ You can use a handheld manual pump. °¨ You can use an electric pump.   °· Make sure the suction cup (flange) on the breast pump is the right size. Place the flange directly over the nipple. If it is the wrong size or placed the wrong way, it may be painful and cause nipple damage.   °· If pumping is uncomfortable, apply a small amount of purified or modified lanolin to your nipple and areola. °· If you are using an electric pump, adjust the speed and suction power to be more comfortable. °· If pumping is painful or if you are not getting very much milk, you may need a different type of pump. A lactation consultant can help you determine what type of pump to use.   °· Keep a full water bottle near you at all times. Drinking lots of fluid helps you make more milk.  °· You can store your milk to use later. Pumped breast milk can be stored in a sealable, sterile container or plastic bag. Label all stored breast milk with the date you pumped it. °¨ Milk can stay out at room temperature for up to 8 hours. °¨   You can store your milk in the refrigerator for up to 8 days. °¨ You can store your milk in the freezer for 3 months. Thaw frozen milk using warm water. Do not put it in the microwave. °· Do not smoke. Smoking can lower your milk supply and harm your infant. If you need help quitting, ask your health care provider to recommend a program.   °WHEN SHOULD I CALL MY HEALTH CARE PROVIDER OR A LACTATION CONSULTANT? °· You are having trouble pumping. °· You are concerned that you are not making enough milk. °· You have nipple pain, soreness, or redness. °· You want to use birth control. Birth control pills may lower your milk supply. Talk to your health care provider about your options. °Document Released: 05/09/2010 Document Revised: 11/24/2013 Document Reviewed:  09/11/2013 °Exit[employer]® Patient Information ©2015 [employer]. This information is not intended to replace advice given to you by your health care provider. Make sure you discuss any questions you have with your health care provider. ° °Nutrition for the New Mother  °A new mother needs good health and nutrition so she can have energy to take care of a new baby. Whether a mother breastfeeds or formula feeds the baby, it is important to have a well-balanced diet. Foods from all the food groups should be chosen to meet the new mother's energy needs and to give her the nutrients needed for repair and healing.  °A HEALTHY EATING PLAN °The My [employer] plan for Moms outlines what you should eat to help you and your baby stay healthy. The energy and amount of food you need depends on whether or not you are breastfeeding. If you are breastfeeding you will need more nutrients. If you choose not to breastfeed, your nutrition goal should be to return to a healthy weight. Limiting calories may be needed if you are not breastfeeding.  °HOME CARE INSTRUCTIONS  °· For a personal plan based on your unique needs, see your Registered Dietitian or visit www.mypyramid.gov. °· Eat a variety of foods. The plan below will help guide you. The following chart has a suggested daily meal plan from the [employer] for Moms. °· Eat a variety of fruits and vegetables. °· Eat more dark green and orange vegetables and cooked dried beans. °· Make half your grains whole grains. Choose whole instead of refined grains. °· Choose low-fat or lean meats and poultry. °· Choose low-fat or fat-free dairy products like milk, cheese, or yogurt. °Fruits °· Breastfeeding: 2 cups °· Non-Breastfeeding: 2 cups °· What Counts as a serving? °¨ 1 cup of fruit or juice. °¨ ½ cup dried fruit. °Vegetables °· Breastfeeding: 3 cups °· Non-Breastfeeding: 2 ½ cups °· What Counts as a serving? °¨ 1 cup raw or cooked vegetables. °¨ Juice or 2 cups raw leafy  vegetables. °Grains °· Breastfeeding: 8 oz °· Non-Breastfeeding: 6 oz °· What Counts as a serving? °¨ 1 slice bread. °¨ 1 oz ready-to-eat cereal. °¨ ½ cup cooked pasta, rice, or cereal. °Meat and Beans °· Breastfeeding: 6 ½ oz °· Non-Breastfeeding: 5 ½ oz °· What Counts as a serving? °¨ 1 oz lean meat, poultry, or fish °¨ ¼ cup cooked dry beans °¨ ½ oz nuts or 1 egg °¨ 1 tbs peanut butter °Milk °· Breastfeeding: 3 cups °· Non-Breastfeeding: 3 cups °· What Counts as a serving? °¨ 1 cup milk. °¨ 8 oz yogurt. °¨ 1 ½ oz cheese. °¨ 2 oz processed cheese. °TIPS FOR THE BREASTFEEDING MOM °· Rapid weight   loss is not suggested when you are breastfeeding. By simply breastfeeding, you will be able to lose the weight gained during your pregnancy. Your caregiver can keep track of your weight and tell you if your weight loss is appropriate. °· Be sure to drink fluids. You may notice that you are thirstier than usual. A suggestion is to drink a glass of water or other beverage whenever you breastfeed. °· Avoid alcohol as it can be passed into your breast milk. °· Limit caffeine drinks to no more than 2 to 3 cups per day. °· You may need to keep taking your prenatal vitamin while you are breastfeeding. Talk with your caregiver about taking a vitamin or supplement. °RETURING TO A HEALTHY WEIGHT °· The My [employer] Plan for Moms will help you return to a healthy weight. It will also provide the nutrients you need. °· You may need to limit "empty" calories. These include: °¨ High fat foods like fried foods, fatty meats, fast food, butter, and mayonnaise. °¨ High sugar foods like sodas, jelly, candy, and sweets. °· Be physically active. Include 30 minutes of exercise or more each day. Choose an activity you like such as walking, swimming, biking, or aerobics. Check with your caregiver before you start to exercise. °Document Released: 02/26/2008 Document Revised: 02/11/2012 Document Reviewed: 02/26/2008 °ExitCare® Patient Information  ©2015 [employer]. This information is not intended to replace advice given to you by your health care provider. Make sure you discuss any questions you have with your health care provider. °Postpartum Depression and Baby Blues °The postpartum period begins right after the birth of a baby. During this time, there is often a great amount of joy and excitement. It is also a time of many changes in the life of the parents. Regardless of how many times a mother gives birth, each child brings new challenges and dynamics to the family. It is not unusual to have feelings of excitement along with confusing shifts in moods, emotions, and thoughts. All mothers are at risk of developing postpartum depression or the "baby blues." These mood changes can occur right after giving birth, or they may occur many months after giving birth. The baby blues or postpartum depression can be mild or severe. Additionally, postpartum depression can go away rather quickly, or it can be a long-term condition.  °CAUSES °Raised hormone levels and the rapid drop in those levels are thought to be a main cause of postpartum depression and the baby blues. A number of hormones change during and after pregnancy. Estrogen and progesterone usually decrease right after the delivery of your baby. The levels of thyroid hormone and various cortisol steroids also rapidly drop. Other factors that play a role in these mood changes include major life events and genetics.  °RISK FACTORS °If you have any of the following risks for the baby blues or postpartum depression, know what symptoms to watch out for during the postpartum period. Risk factors that may increase the likelihood of getting the baby blues or postpartum depression include: °· Having a personal or family history of depression.   °· Having depression while being pregnant.   °· Having premenstrual mood issues or mood issues related to oral contraceptives. °· Having a lot of life stress.   °· Having  marital conflict.   °· Lacking a social support network.   °· Having a baby with special needs.   °· Having health problems, such as diabetes.   °SIGNS AND SYMPTOMS °Symptoms of baby blues include: °· Brief changes in mood, such as going   from extreme happiness to sadness. °· Decreased concentration.   °· Difficulty sleeping.   °· Crying spells, tearfulness.   °· Irritability.   °· Anxiety.   °Symptoms of postpartum depression typically begin within the first month after giving birth. These symptoms include: °· Difficulty sleeping or excessive sleepiness.   °· Marked weight loss.   °· Agitation.   °· Feelings of worthlessness.   °· Lack of interest in activity or food.   °Postpartum psychosis is a very serious condition and can be dangerous. Fortunately, it is rare. Displaying any of the following symptoms is cause for immediate medical attention. Symptoms of postpartum psychosis include:  °· Hallucinations and delusions.   °· Bizarre or disorganized behavior.   °· Confusion or disorientation.   °DIAGNOSIS  °A diagnosis is made by an evaluation of your symptoms. There are no medical or lab tests that lead to a diagnosis, but there are various questionnaires that a health care provider may use to identify those with the baby blues, postpartum depression, or psychosis. Often, a screening tool called the Edinburgh Postnatal Depression Scale is used to diagnose depression in the postpartum period.  °TREATMENT °The baby blues usually goes away on its own in 1-2 weeks. Social support is often all that is needed. You will be encouraged to get adequate sleep and rest. Occasionally, you may be given medicines to help you sleep.  °Postpartum depression requires treatment because it can last several months or longer if it is not treated. Treatment may include individual or group therapy, medicine, or both to address any social, physiological, and psychological factors that may play a role in the depression. Regular exercise, a  healthy diet, rest, and social support may also be strongly recommended.  °Postpartum psychosis is more serious and needs treatment right away. Hospitalization is often needed. °HOME CARE INSTRUCTIONS °· Get as much rest as you can. Nap when the baby sleeps.   °· Exercise regularly. Some women find yoga and walking to be beneficial.   °· Eat a balanced and nourishing diet.   °· Do little things that you enjoy. Have a cup of tea, take a bubble bath, read your favorite magazine, or listen to your favorite music. °· Avoid alcohol.   °· Ask for help with household chores, cooking, grocery shopping, or running errands as needed. Do not try to do everything.   °· Talk to people close to you about how you are feeling. Get support from your partner, family members, friends, or other new moms. °· Try to stay positive in how you think. Think about the things you are grateful for.   °· Do not spend a lot of time alone.   °· Only take over-the-counter or prescription medicine as directed by your health care provider. °· Keep all your postpartum appointments.   °· Let your health care provider know if you have any concerns.   °SEEK MEDICAL CARE IF: °You are having a reaction to or problems with your medicine. °SEEK IMMEDIATE MEDICAL CARE IF: °· You have suicidal feelings.   °· You think you may harm the baby or someone else. °MAKE SURE YOU: °· Understand these instructions. °· Will watch your condition. °· Will get help right away if you are not doing well or get worse. °Document Released: 08/23/2004 Document Revised: 11/24/2013 Document Reviewed: 08/31/2013 °ExitCare® Patient Information ©2015 [employer]. This information is not intended to replace advice given to you by your health care provider. Make sure you discuss any questions you have with your health care provider. °Breastfeeding and Mastitis °Mastitis is inflammation of the breast tissue. It can occur in women who   are breastfeeding. This can make breastfeeding  painful. Mastitis will sometimes go away on its own. Your health care provider will help determine if treatment is needed. °CAUSES °Mastitis is often associated with a blocked milk (lactiferous) duct. This can happen when too much milk builds up in the breast. Causes of excess milk in the breast can include: °· Poor latch-on. If your baby is not latched onto the breast properly, she or he may not empty your breast completely while breastfeeding. °· Allowing too much time to pass between feedings. °· Wearing a bra or other clothing that is too tight. This puts extra pressure on the lactiferous ducts so milk does not flow through them as it should. °Mastitis can also be caused by a bacterial infection. Bacteria may enter the breast tissue through cuts or openings in the skin. In women who are breastfeeding, this may occur because of cracked or irritated skin. Cracks in the skin are often caused when your baby does not latch on properly to the breast. °SIGNS AND SYMPTOMS °· Swelling, redness, tenderness, and pain in an area of the breast. °· Swelling of the glands under the arm on the same side. °· Fever may or may not accompany mastitis. °If an infection is allowed to progress, a collection of pus (abscess) may develop. °DIAGNOSIS  °Your health care provider can usually diagnose mastitis based on your symptoms and a physical exam. Tests may be done to help confirm the diagnosis. These may include: °· Removal of pus from the breast by applying pressure to the area. This pus can be examined in the lab to determine which bacteria are present. If an abscess has developed, the fluid in the abscess can be removed with a needle. This can also be used to confirm the diagnosis and determine the bacteria present. In most cases, pus will not be present. °· Blood tests to determine if your body is fighting a bacterial infection. °· Mammogram or ultrasound tests to rule out other problems or diseases. °TREATMENT  °Mastitis that  occurs with breastfeeding will sometimes go away on its own. Your health care provider may choose to wait 24 hours after first seeing you to decide whether a prescription medicine is needed. If your symptoms are worse after 24 hours, your health care provider will likely prescribe an antibiotic medicine to treat the mastitis. He or she will determine which bacteria are most likely causing the infection and will then select an appropriate antibiotic medicine. This is sometimes changed based on the results of tests performed to identify the bacteria, or if there is no response to the antibiotic medicine selected. Antibiotic medicines are usually given by mouth. You may also be given medicine for pain. °HOME CARE INSTRUCTIONS °· Only take over-the-counter or prescription medicines for pain, fever, or discomfort as directed by your health care provider. °· If your health care provider prescribed an antibiotic medicine, take the medicine as directed. Make sure you finish it even if you start to feel better. °· Do not wear a tight or underwire bra. Wear a soft, supportive bra. °· Increase your fluid intake, especially if you have a fever. °· Continue to empty the breast. Your health care provider can tell you whether this milk is safe for your infant or needs to be thrown out. You may be told to stop nursing until your health care provider thinks it is safe for your baby. Use a breast pump if you are advised to stop nursing. °· Keep your nipples   clean and dry. °· Empty the first breast completely before going to the other breast. If your baby is not emptying your breasts completely for some reason, use a breast pump to empty your breasts. °· If you go back to work, pump your breasts while at work to stay in time with your nursing schedule. °· Avoid allowing your breasts to become overly filled with milk (engorged). °SEEK MEDICAL CARE IF: °· You have pus-like discharge from the breast. °· Your symptoms do not improve with  the treatment prescribed by your health care provider within 2 days. °SEEK IMMEDIATE MEDICAL CARE IF: °· Your pain and swelling are getting worse. °· You have pain that is not controlled with medicine. °· You have a red line extending from the breast toward your armpit. °· You have a fever or persistent symptoms for more than 2-3 days. °· You have a fever and your symptoms suddenly get worse. °MAKE SURE YOU:  °· Understand these instructions. °· Will watch your condition. °· Will get help right away if you are not doing well or get worse. °Document Released: 03/16/2005 Document Revised: 11/24/2013 Document Reviewed: 06/25/2013 °[employer]® Patient Information ©2015 [employer]. This information is not intended to replace advice given to you by your health care provider. Make sure you discuss any questions you have with your health care provider. °Breastfeeding °Deciding to breastfeed is one of the best choices you can make for you and your baby. A change in hormones during pregnancy causes your breast tissue to grow and increases the number and size of your milk ducts. These hormones also allow proteins, sugars, and fats from your blood supply to make breast milk in your milk-producing glands. Hormones prevent breast milk from being released before your baby is born as well as prompt milk flow after birth. Once breastfeeding has begun, thoughts of your baby, as well as his or her sucking or crying, can stimulate the release of milk from your milk-producing glands.  °BENEFITS OF BREASTFEEDING °For Your Baby °· Your first milk (colostrum) helps your baby's digestive system function better.   °· There are antibodies in your milk that help your baby fight off infections.   °· Your baby has a lower incidence of asthma, allergies, and sudden infant death syndrome.   °· The nutrients in breast milk are better for your baby than infant formulas and are designed uniquely for your baby's needs.   °· Breast milk improves your  baby's brain development.   °· Your baby is less likely to develop other conditions, such as childhood obesity, asthma, or type 2 diabetes mellitus.   °For You  °· Breastfeeding helps to create a very special bond between you and your baby.   °· Breastfeeding is convenient. Breast milk is always available at the correct temperature and costs nothing.   °· Breastfeeding helps to burn calories and helps you lose the weight gained during pregnancy.   °· Breastfeeding makes your uterus contract to its prepregnancy size faster and slows bleeding (lochia) after you give birth.   °· Breastfeeding helps to lower your risk of developing type 2 diabetes mellitus, osteoporosis, and breast or ovarian cancer later in life. °SIGNS THAT YOUR BABY IS HUNGRY °Early Signs of Hunger  °· Increased alertness or activity. °· Stretching. °· Movement of the head from side to side. °· Movement of the head and opening of the mouth when the corner of the mouth or cheek is stroked (rooting). °· Increased sucking sounds, smacking lips, cooing, sighing, or squeaking. °· Hand-to-mouth movements. °· Increased sucking of   fingers or hands. °Late Signs of Hunger °· Fussing. °· Intermittent crying. °Extreme Signs of Hunger °Signs of extreme hunger will require calming and consoling before your baby will be able to breastfeed successfully. Do not wait for the following signs of extreme hunger to occur before you initiate breastfeeding:   °· Restlessness. °· A loud, strong cry. °·  Screaming. °BREASTFEEDING BASICS °Breastfeeding Initiation °· Find a comfortable place to sit or lie down, with your neck and back well supported. °· Place a pillow or rolled up blanket under your baby to bring him or her to the level of your breast (if you are seated). Nursing pillows are specially designed to help support your arms and your baby while you breastfeed. °· Make sure that your baby's abdomen is facing your abdomen.   °· Gently massage your breast. With your  fingertips, massage from your chest wall toward your nipple in a circular motion. This encourages milk flow. You may need to continue this action during the feeding if your milk flows slowly. °· Support your breast with 4 fingers underneath and your thumb above your nipple. Make sure your fingers are well away from your nipple and your baby's mouth.   °· Stroke your baby's lips gently with your finger or nipple.   °· When your baby's mouth is open wide enough, quickly bring your baby to your breast, placing your entire nipple and as much of the colored area around your nipple (areola) as possible into your baby's mouth.   °¨ More areola should be visible above your baby's upper lip than below the lower lip.   °¨ Your baby's tongue should be between his or her lower gum and your breast.   °· Ensure that your baby's mouth is correctly positioned around your nipple (latched). Your baby's lips should create a seal on your breast and be turned out (everted). °· It is common for your baby to suck about 2-3 minutes in order to start the flow of breast milk. °Latching °Teaching your baby how to latch on to your breast properly is very important. An improper latch can cause nipple pain and decreased milk supply for you and poor weight gain in your baby. Also, if your baby is not latched onto your nipple properly, he or she may swallow some air during feeding. This can make your baby fussy. Burping your baby when you switch breasts during the feeding can help to get rid of the air. However, teaching your baby to latch on properly is still the best way to prevent fussiness from swallowing air while breastfeeding. °Signs that your baby has successfully latched on to your nipple:    °· Silent tugging or silent sucking, without causing you pain.   °· Swallowing heard between every 3-4 sucks.   °·  Muscle movement above and in front of his or her ears while sucking.   °Signs that your baby has not successfully latched on to  nipple:  °· Sucking sounds or smacking sounds from your baby while breastfeeding. °· Nipple pain. °If you think your baby has not latched on correctly, slip your finger into the corner of your baby's mouth to break the suction and place it between your baby's gums. Attempt breastfeeding initiation again. °Signs of Successful Breastfeeding °Signs from your baby:   °· A gradual decrease in the number of sucks or complete cessation of sucking.   °· Falling asleep.   °· Relaxation of his or her body.   °· Retention of a small amount of milk in his or her mouth.   °· Letting go   of your breast by himself or herself. °Signs from you: °· Breasts that have increased in firmness, weight, and size 1-3 hours after feeding.   °· Breasts that are softer immediately after breastfeeding. °· Increased milk volume, as well as a change in milk consistency and color by the fifth day of breastfeeding.   °· Nipples that are not sore, cracked, or bleeding. °Signs That Your Baby is Getting Enough Milk °· Wetting at least 3 diapers in a 24-hour period. The urine should be clear and pale yellow by age 5 days. °· At least 3 stools in a 24-hour period by age 5 days. The stool should be soft and yellow. °· At least 3 stools in a 24-hour period by age 7 days. The stool should be seedy and yellow. °· No loss of weight greater than 10% of birth weight during the first 3 days of age. °· Average weight gain of 4-7 ounces (113-198 g) per week after age 4 days. °· Consistent daily weight gain by age 5 days, without weight loss after the age of 2 weeks. °After a feeding, your baby may spit up a small amount. This is common. °BREASTFEEDING FREQUENCY AND DURATION °Frequent feeding will help you make more milk and can prevent sore nipples and breast engorgement. Breastfeed when you feel the need to reduce the fullness of your breasts or when your baby shows signs of hunger. This is called "breastfeeding on demand." Avoid introducing a pacifier to your  baby while you are working to establish breastfeeding (the first 4-6 weeks after your baby is born). After this time you may choose to use a pacifier. Research has shown that pacifier use during the first year of a baby's life decreases the risk of sudden infant death syndrome (SIDS). °Allow your baby to feed on each breast as long as he or she wants. Breastfeed until your baby is finished feeding. When your baby unlatches or falls asleep while feeding from the first breast, offer the second breast. Because newborns are often sleepy in the first few weeks of life, you may need to awaken your baby to get him or her to feed. °Breastfeeding times will vary from baby to baby. However, the following rules can serve as a guide to help you ensure that your baby is properly fed: °· Newborns (babies 4 weeks of age or younger) may breastfeed every 1-3 hours. °· Newborns should not go longer than 3 hours during the day or 5 hours during the night without breastfeeding. °· You should breastfeed your baby a minimum of 8 times in a 24-hour period until you begin to introduce solid foods to your baby at around 6 months of age. °BREAST MILK PUMPING °Pumping and storing breast milk allows you to ensure that your baby is exclusively fed your breast milk, even at times when you are unable to breastfeed. This is especially important if you are going back to work while you are still breastfeeding or when you are not able to be present during feedings. Your lactation consultant can give you guidelines on how long it is safe to store breast milk.  °A breast pump is a machine that allows you to pump milk from your breast into a sterile bottle. The pumped breast milk can then be stored in a refrigerator or freezer. Some breast pumps are operated by hand, while others use electricity. Ask your lactation consultant which type will work best for you. Breast pumps can be purchased, but some hospitals and breastfeeding support groups   lease  breast pumps on a monthly basis. A lactation consultant can teach you how to hand express breast milk, if you prefer not to use a pump.  °CARING FOR YOUR BREASTS WHILE YOU BREASTFEED °Nipples can become dry, cracked, and sore while breastfeeding. The following recommendations can help keep your breasts moisturized and healthy: °· Avoid using soap on your nipples.   °· Wear a supportive bra. Although not required, special nursing bras and tank tops are designed to allow access to your breasts for breastfeeding without taking off your entire bra or top. Avoid wearing underwire-style bras or extremely tight bras. °· Air dry your nipples for 3-4 minutes after each feeding.   °· Use only cotton bra pads to absorb leaked breast milk. Leaking of breast milk between feedings is normal.   °· Use lanolin on your nipples after breastfeeding. Lanolin helps to maintain your skin's normal moisture barrier. If you use pure lanolin, you do not need to wash it off before feeding your baby again. Pure lanolin is not toxic to your baby. You may also hand express a few drops of breast milk and gently massage that milk into your nipples and allow the milk to air dry. °In the first few weeks after giving birth, some women experience extremely full breasts (engorgement). Engorgement can make your breasts feel heavy, warm, and tender to the touch. Engorgement peaks within 3-5 days after you give birth. The following recommendations can help ease engorgement: °· Completely empty your breasts while breastfeeding or pumping. You may want to start by applying warm, moist heat (in the shower or with warm water-soaked hand towels) just before feeding or pumping. This increases circulation and helps the milk flow. If your baby does not completely empty your breasts while breastfeeding, pump any extra milk after he or she is finished. °· Wear a snug bra (nursing or regular) or tank top for 1-2 days to signal your body to slightly decrease milk  production. °· Apply ice packs to your breasts, unless this is too uncomfortable for you. °· Make sure that your baby is latched on and positioned properly while breastfeeding. °If engorgement persists after 48 hours of following these recommendations, contact your health care provider or a lactation consultant. °OVERALL HEALTH CARE RECOMMENDATIONS WHILE BREASTFEEDING °· Eat healthy foods. Alternate between meals and snacks, eating 3 of each per day. Because what you eat affects your breast milk, some of the foods may make your baby more irritable than usual. Avoid eating these foods if you are sure that they are negatively affecting your baby. °· Drink milk, fruit juice, and water to satisfy your thirst (about 10 glasses a day).   °· Rest often, relax, and continue to take your prenatal vitamins to prevent fatigue, stress, and anemia. °· Continue breast self-awareness checks. °· Avoid chewing and smoking tobacco. °· Avoid alcohol and drug use. °Some medicines that may be harmful to your baby can pass through breast milk. It is important to ask your health care provider before taking any medicine, including all over-the-counter and prescription medicine as well as vitamin and herbal supplements. °It is possible to become pregnant while breastfeeding. If birth control is desired, ask your health care provider about options that will be safe for your baby. °SEEK MEDICAL CARE IF:  °· You feel like you want to stop breastfeeding or have become frustrated with breastfeeding. °· You have painful breasts or nipples. °· Your nipples are cracked or bleeding. °· Your breasts are red, tender, or warm. °· You have   a swollen area on either breast. °· You have a fever or chills. °· You have nausea or vomiting. °· You have drainage other than breast milk from your nipples. °· Your breasts do not become full before feedings by the fifth day after you give birth. °· You feel sad and depressed. °· Your baby is too sleepy to eat  well. °· Your baby is having trouble sleeping.   °· Your baby is wetting less than 3 diapers in a 24-hour period. °· Your baby has less than 3 stools in a 24-hour period. °· Your baby's skin or the white part of his or her eyes becomes yellow.   °· Your baby is not gaining weight by 5 days of age. °SEEK IMMEDIATE MEDICAL CARE IF:  °· Your baby is overly tired (lethargic) and does not want to wake up and feed. °· Your baby develops an unexplained fever. °Document Released: 11/19/2005 Document Revised: 11/24/2013 Document Reviewed: 05/13/2013 °Exit[employer]® Patient Information ©2015 [employer]. This information is not intended to replace advice given to you by your health care provider. Make sure you discuss any questions you have with your health care provider. ° °

## 2014-10-25 NOTE — Plan of Care (Signed)
Problem: Discharge Progression Outcomes Goal: Barriers To Progression Addressed/Resolved Outcome: Not Applicable Date Met:  99/06/89 Goal: Complications resolved/controlled Outcome: Not Applicable Date Met:  34/06/84 Goal: Afebrile, VS remain stable at discharge Outcome: Completed/Met Date Met:  10/25/14

## 2014-10-25 NOTE — Lactation Note (Signed)
This note was copied from the chart of Sara Wright Sanzo. Lactation Consultation Note  Follow up visit made.  Mom is holding baby in arms and he is showing feeding cues.  She states baby just finished feeding.  Explained to mom that baby is still cueing and may still be hungry.  Reviewed cluster feeding.  Mom feels breasts are filling and pumped 30 mls last night.  Mom states she plans to pump and bottle feed when she goes home.  She is getting a pump from her [employer] company tomorrow.  Recommended putting baby back to breast or post pumping and giving EBM back to baby.  Mom desires to pump.  Reviewed pump use and no questions or concerns.  Outpatient lactation services encouraged.  Patient Name: Sara Wright Bick UJWJX'BToday's Date: 10/25/2014     Maternal Data    Feeding Feeding Type: Breast Fed Length of feed: 10 min  LATCH Score/Interventions Latch: Grasps breast easily, tongue down, lips flanged, rhythmical sucking.  Audible Swallowing: A few with stimulation Intervention(s): Skin to skin  Type of Nipple: Everted at rest and after stimulation  Comfort (Breast/Nipple): Soft / non-tender     Hold (Positioning): No assistance needed to correctly position infant at breast.  LATCH Score: 9  Lactation Tools Discussed/Used     Consult Status      Huston FoleyMOULDEN, Obi Scrima S 10/25/2014, 3:36 PM

## 2014-10-25 NOTE — Plan of Care (Signed)
Problem: Discharge Progression Outcomes Goal: Discharge plan in place and appropriate Outcome: Completed/Met Date Met:  10/25/14     

## 2014-10-25 NOTE — Progress Notes (Signed)
POSTOPERATIVE DAY # 2 S/P CS for non-reassuring FHR  S:         Reports feeling well - desires early discharge today             Tolerating po intake / no nausea / no vomiting / + flatus / + BM             Bleeding is light             Pain controlled with motrin and percocet             Up ad lib / ambulatory/ voiding QS  Newborn breast-feeding / elevated bili level - currently on bili-lights / circumcision planned prior to d/c  O:  VS: BP 108/57 mmHg  Pulse 83  Temp(Src) 97.5 F (36.4 C) (Oral)  Resp 18  Ht 5' 2" (1.575 m)  Wt 88.905 kg (196 lb)  BMI 35.84 kg/m2  SpO2 100%  LMP 01/05/2014  Currently Breastfeeding   LABS:                Recent Labs  10/23/14 0530 10/24/14 0529  WBC 15.5* 13.7*  HGB 12.2 9.6*  PLT 242 193               Bloodtype: O POS (11/21 0530)  Rubella: Nonimmune (04/06 0000)  - MMR prior to DC             tdaP and Flu given antenatal at [facility]                                                     Physical Exam:             Alert and Oriented X3  Lungs: Clear and unlabored  Heart: regular rate and rhythm / no mumurs  Abdomen: soft, non-tender, non-distended             Fundus: firm, non-tender, U-1             Dressing intact              Incision:  approximated with staples / no erythema / no ecchymosis / no drainage  Extremities: trace edema, no calf pain or tenderness  A:        POD # 2 S/P CS for non-reassuring FHR            ABL anemia  P:        Routine postoperative care              Advance post-op care             Early DC home today             Encouraged increased feedings   Wright, Sara, M MSN, CNM  10/25/2014, 11:01 AM  

## 2014-10-26 NOTE — Discharge Summary (Signed)
POSTOPERATIVE DISCHARGE SUMMARY:  Patient ID: Sara Wright MRN: [id number] DOB/AGE: 1986-09-07 28 y.o.  Admit date: 10/23/2014 Admission Diagnoses: Postdates Pregnancy / Active Labor   Discharge date: 10/25/2014 Discharge Diagnoses: S/P Primary C/S due to Non-Reassuring FHR on 10/23/2014        Prenatal history: G1P1001   EDC : 10/12/2014, by Last Menstrual Period  Has received prenatal care at [facility] since 8.[redacted] wks gestation. Primary provider : Artelia Laroche, CNM Prenatal course complicated by (+) GBS / ? Borderline hyperthyroidism / Postdates  Prenatal Labs: ABO, Rh: O POS (11/21 0530)  Antibody: NEG (11/21 0530) Rubella: Nonimmune (04/06 0000)  / MMR booster 10/25/2014 RPR: NON REAC (11/21 0530)  HBsAg: Negative (04/06 0000)  HIV: Non-reactive (04/06 0000)  GTT : Normal GBS: Positive (04/06 0000)   Medical / Surgical History :  Past medical history:  Past Medical History  Diagnosis Date  . Hyperthyroidism     Labs WNL during pregnancy; no meds    Past surgical history:  Past Surgical History  Procedure Laterality Date  . Wisdom tooth extraction    . Wisdom tooth extraction    . Cesarean section N/A 10/23/2014    Procedure: CESAREAN SECTION;  Surgeon: Marvene Staff, MD;  Location: [facility];  Service: Obstetrics;  Laterality: N/A;     Allergies: Penicillins   Intrapartum Course:  Admitted for active labor and postdates / repetitive variable decelerations in FHR / AROM and insertion of IUPC for amnioinfusion / moderate meconium stained fluid / repetitive & prolonged variable decelerations in FHR / late decelerations in FHR / decision for urgent cesarean delivery made by Dr. Garwin Brothers / cesarean delivery of viable female by Dr. Garwin Brothers / no immediate postpartum complications noted   Physical Exam:   VSS: Blood pressure 108/57, pulse 83, temperature 97.5 F (36.4 C), temperature source Oral, resp. rate 18, height 5' 2" (1.575 m), weight  88.905 kg (196 lb), last menstrual period 01/05/2014, SpO2 100 %, if currently breastfeeding.  LABS:   Recent Labs  10/24/14 0529  WBC 13.7*  HGB 9.6*  PLT 193    Newborn Data Live born female  Birth Weight: 8 lb 8.3 oz (3865 g) APGAR: 8, 9  See operative report for further details  Home with mother.  Discharge Instructions:  Wound Care: keep clean and dry / schedule staple removal POD 6 at [facility] Postpartum Instructions: [facility] discharge booklet - instructions reviewed Medications:    Medication List    TAKE these medications        ibuprofen 600 MG tablet  Commonly known as:  ADVIL,MOTRIN  Take 1 tablet (600 mg total) by mouth every 6 (six) hours.     oxyCODONE-acetaminophen 5-325 MG per tablet  Commonly known as:  PERCOCET/ROXICET  Take 1 tablet by mouth every 4 (four) hours as needed (for pain scale less than 7).     prenatal multivitamin Tabs tablet  Take 1 tablet by mouth daily at 12 noon.     ranitidine 150 MG tablet  Commonly known as:  ZANTAC  Take 150 mg by mouth 2 (two) times daily.       Follow-up Information    Follow up with Artelia Laroche, CNM. Schedule an appointment as soon as possible for a visit in 1 week.   Specialty:  Obstetrics and Gynecology   Why:  For staple removal with one of the office RNs   Contact information:   [address] [place] [address] [phone]  Follow up with BAILEY, TANYA, CNM. Schedule an appointment as soon as possible for a visit in 6 weeks.   Specialty:  Obstetrics and Gynecology   Why:  postpartum visit   Contact information:   [address] [place] [address] [phone]         Signed: DAWSON, ROLITTA, M, MSN, CNM 10/25/2014, 11:22 AM   

## 2014-10-27 LAB — TYPE AND SCREEN
ABO/RH(D): O POS
Antibody Screen: NEGATIVE
Unit division: 0
Unit division: 0

## 2014-11-11 ENCOUNTER — Other Ambulatory Visit: Payer: Self-pay | Admitting: Obstetrics and Gynecology

## 2014-11-11 ENCOUNTER — Ambulatory Visit
Admission: RE | Admit: 2014-11-11 | Discharge: 2014-11-11 | Disposition: A | Discharge status: Home / Self Care | Payer: BC Managed Care – PPO | Source: Ambulatory Visit | Attending: Obstetrics and Gynecology | Admitting: Obstetrics and Gynecology

## 2014-11-11 DIAGNOSIS — N6310 Unspecified lump in the right breast, unspecified quadrant: Principal | ICD-10-CM

## 2014-11-11 DIAGNOSIS — N644 Mastodynia: Secondary | ICD-10-CM

## 2014-11-11 DIAGNOSIS — N6315 Unspecified lump in the right breast, overlapping quadrants: Secondary | ICD-10-CM

## 2016-12-14 DIAGNOSIS — Z0000 Encounter for general adult medical examination without abnormal findings: Secondary | ICD-10-CM | POA: Diagnosis not present

## 2016-12-14 DIAGNOSIS — R946 Abnormal results of thyroid function studies: Secondary | ICD-10-CM | POA: Diagnosis not present

## 2016-12-28 DIAGNOSIS — M2669 Other specified disorders of temporomandibular joint: Secondary | ICD-10-CM | POA: Diagnosis not present

## 2016-12-28 DIAGNOSIS — R946 Abnormal results of thyroid function studies: Secondary | ICD-10-CM | POA: Diagnosis not present

## 2016-12-28 DIAGNOSIS — Z0000 Encounter for general adult medical examination without abnormal findings: Secondary | ICD-10-CM | POA: Diagnosis not present

## 2016-12-28 DIAGNOSIS — Z1389 Encounter for screening for other disorder: Secondary | ICD-10-CM | POA: Diagnosis not present

## 2016-12-28 DIAGNOSIS — E663 Overweight: Secondary | ICD-10-CM | POA: Diagnosis not present

## 2016-12-28 DIAGNOSIS — J3089 Other allergic rhinitis: Secondary | ICD-10-CM | POA: Diagnosis not present

## 2017-01-29 DIAGNOSIS — R51 Headache: Secondary | ICD-10-CM | POA: Diagnosis not present

## 2017-01-29 DIAGNOSIS — R946 Abnormal results of thyroid function studies: Secondary | ICD-10-CM | POA: Diagnosis not present

## 2017-01-30 ENCOUNTER — Other Ambulatory Visit: Payer: Self-pay | Admitting: Internal Medicine

## 2017-01-30 DIAGNOSIS — R51 Headache: Principal | ICD-10-CM

## 2017-01-30 DIAGNOSIS — R519 Headache, unspecified: Secondary | ICD-10-CM

## 2017-02-01 ENCOUNTER — Ambulatory Visit
Admission: RE | Admit: 2017-02-01 | Discharge: 2017-02-01 | Disposition: A | Discharge status: Home / Self Care | Payer: BLUE CROSS/BLUE SHIELD | Source: Ambulatory Visit | Attending: Internal Medicine | Admitting: Internal Medicine

## 2017-02-01 DIAGNOSIS — R51 Headache: Principal | ICD-10-CM

## 2017-02-01 DIAGNOSIS — R519 Headache, unspecified: Secondary | ICD-10-CM

## 2017-05-22 DIAGNOSIS — Z6834 Body mass index (BMI) 34.0-34.9, adult: Secondary | ICD-10-CM | POA: Diagnosis not present

## 2017-05-22 DIAGNOSIS — Z01419 Encounter for gynecological examination (general) (routine) without abnormal findings: Secondary | ICD-10-CM | POA: Diagnosis not present

## 2017-08-15 DIAGNOSIS — D224 Melanocytic nevi of scalp and neck: Secondary | ICD-10-CM | POA: Diagnosis not present

## 2017-08-15 DIAGNOSIS — L821 Other seborrheic keratosis: Secondary | ICD-10-CM | POA: Diagnosis not present

## 2017-08-15 DIAGNOSIS — D225 Melanocytic nevi of trunk: Secondary | ICD-10-CM | POA: Diagnosis not present

## 2017-08-15 DIAGNOSIS — D2262 Melanocytic nevi of left upper limb, including shoulder: Secondary | ICD-10-CM | POA: Diagnosis not present

## 2017-08-15 DIAGNOSIS — D2272 Melanocytic nevi of left lower limb, including hip: Secondary | ICD-10-CM | POA: Diagnosis not present

## 2017-08-15 DIAGNOSIS — D485 Neoplasm of uncertain behavior of skin: Secondary | ICD-10-CM | POA: Diagnosis not present

## 2018-01-20 DIAGNOSIS — R946 Abnormal results of thyroid function studies: Secondary | ICD-10-CM | POA: Diagnosis not present

## 2018-01-20 DIAGNOSIS — Z0000 Encounter for general adult medical examination without abnormal findings: Secondary | ICD-10-CM | POA: Diagnosis not present

## 2018-01-24 DIAGNOSIS — J3089 Other allergic rhinitis: Secondary | ICD-10-CM | POA: Diagnosis not present

## 2018-01-24 DIAGNOSIS — L718 Other rosacea: Secondary | ICD-10-CM | POA: Diagnosis not present

## 2018-01-24 DIAGNOSIS — E663 Overweight: Secondary | ICD-10-CM | POA: Diagnosis not present

## 2018-01-24 DIAGNOSIS — Z0000 Encounter for general adult medical examination without abnormal findings: Secondary | ICD-10-CM | POA: Diagnosis not present

## 2018-01-24 DIAGNOSIS — Z1389 Encounter for screening for other disorder: Secondary | ICD-10-CM | POA: Diagnosis not present

## 2018-01-24 DIAGNOSIS — R946 Abnormal results of thyroid function studies: Secondary | ICD-10-CM | POA: Diagnosis not present

## 2018-07-14 DIAGNOSIS — Z01419 Encounter for gynecological examination (general) (routine) without abnormal findings: Secondary | ICD-10-CM | POA: Diagnosis not present

## 2018-07-14 DIAGNOSIS — Z6833 Body mass index (BMI) 33.0-33.9, adult: Secondary | ICD-10-CM | POA: Diagnosis not present

## 2018-07-14 DIAGNOSIS — Z1151 Encounter for screening for human papillomavirus (HPV): Secondary | ICD-10-CM | POA: Diagnosis not present

## 2018-07-24 IMAGING — CT CT HEAD W/O CM
3 of 4 series · 16 of 47 positions shown, 19 images · non-contrast
Comparison: Paranasal sinus radiographs 04/04/2004.

CLINICAL DATA: 30-year-old female with right temporal headaches for
10 days. Severe headache on 01/26/2017. Initial encounter.

EXAM:
CT HEAD WITHOUT CONTRAST
TECHNIQUE: Contiguous axial images were obtained from the base of the skull
through the vertex without intravenous contrast.

[Series 32: 3d filtered head w/o · axial · non-contrast · 0.49mm/px · z∈[-28,+92]mm · 10 of 28 slices shown, 13 images]
[im 2/28  brain]
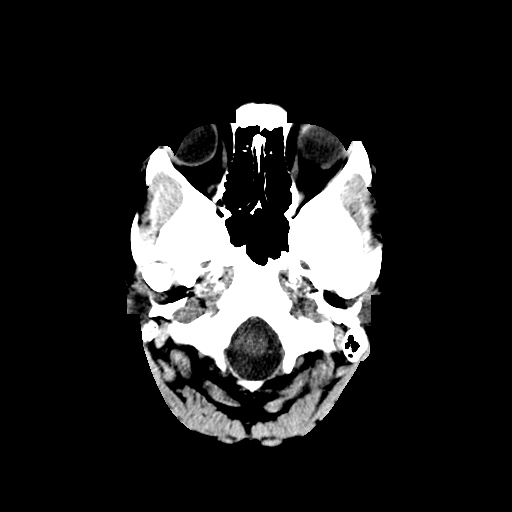
[im 2/28  bone]
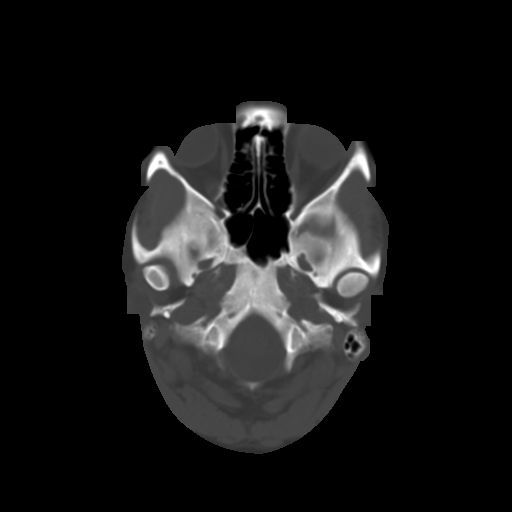
[im 4/28  brain]
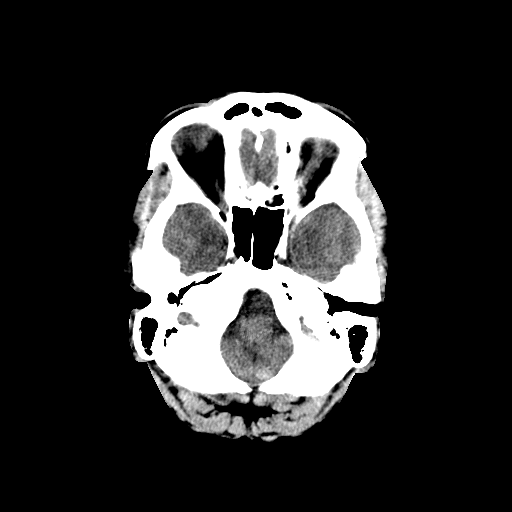
[im 8/28  brain]
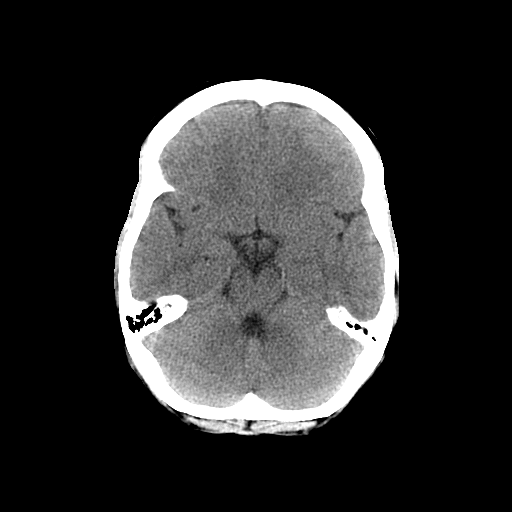
[im 10/28  brain]
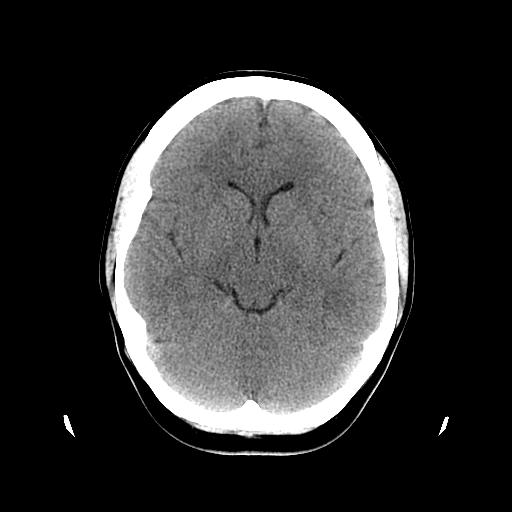
[im 12/28  brain]
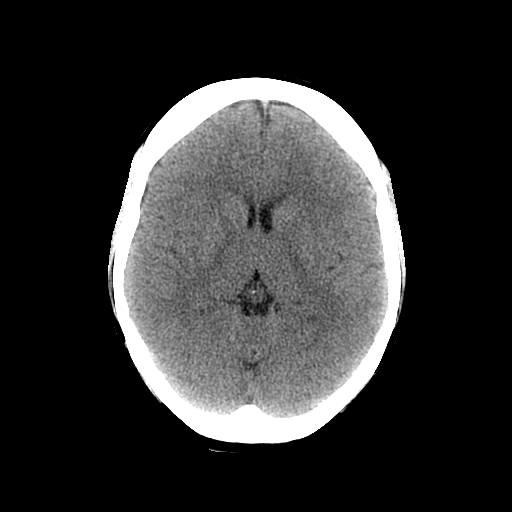
[im 12/28  bone]
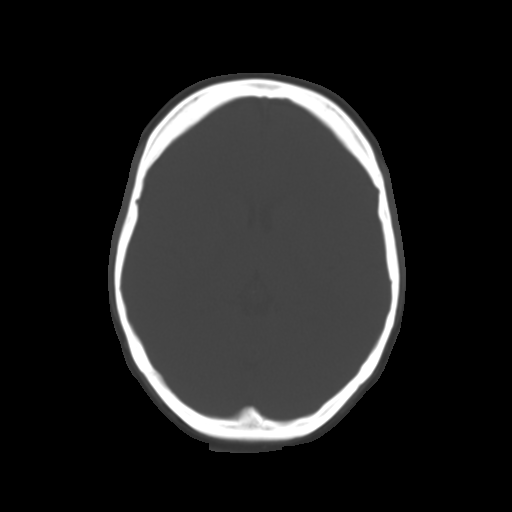
[im 16/28  brain]
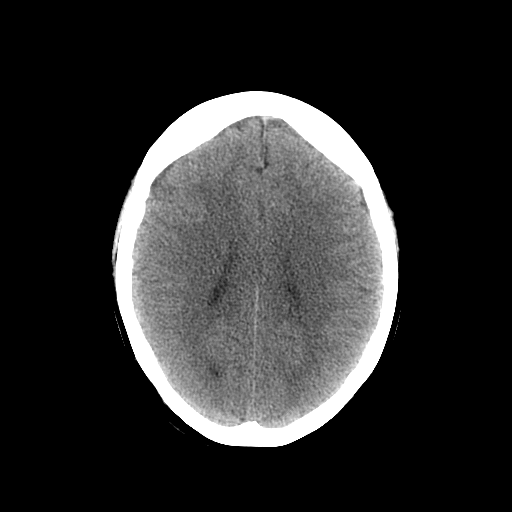
[im 18/28  brain]
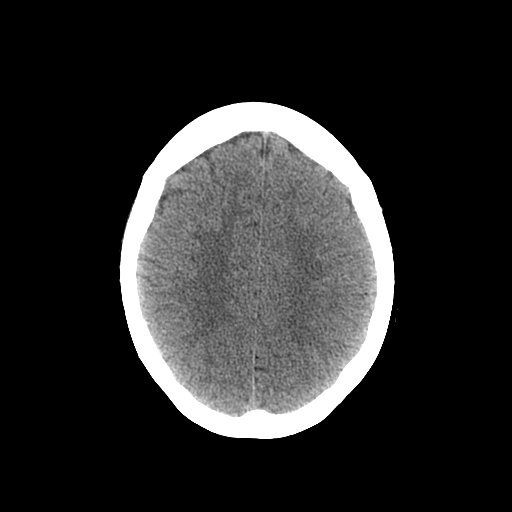
[im 20/28  brain]
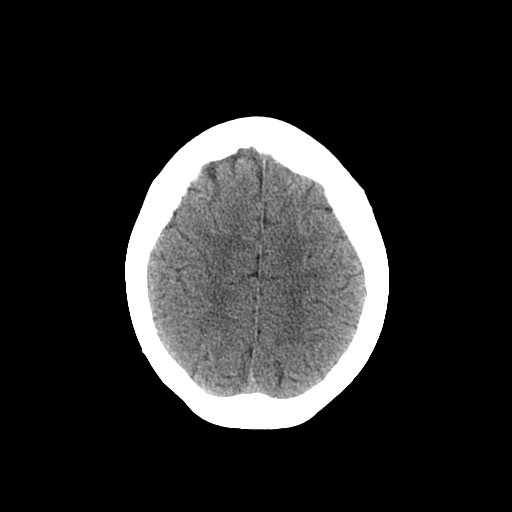
[im 24/28  brain]
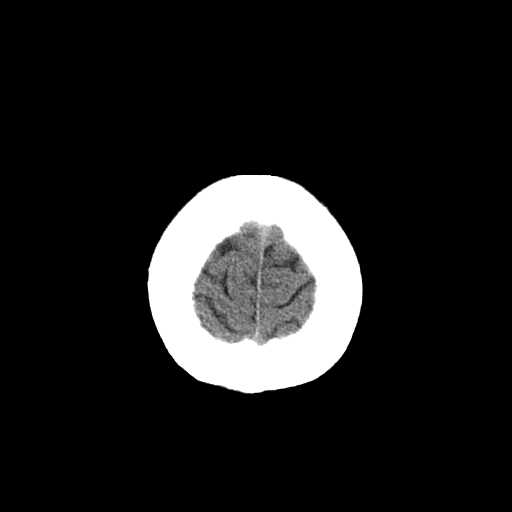
[im 24/28  bone]
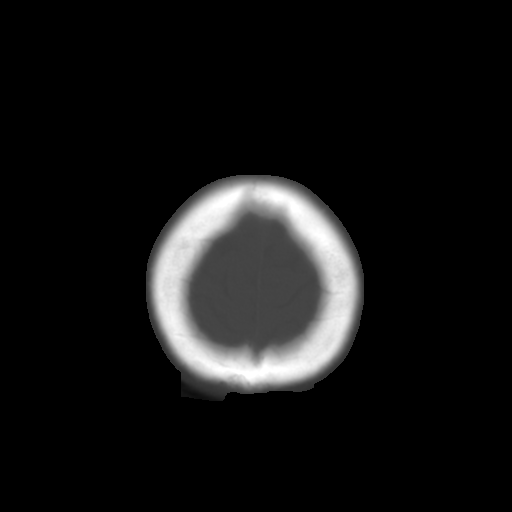
[im 26/28  brain]
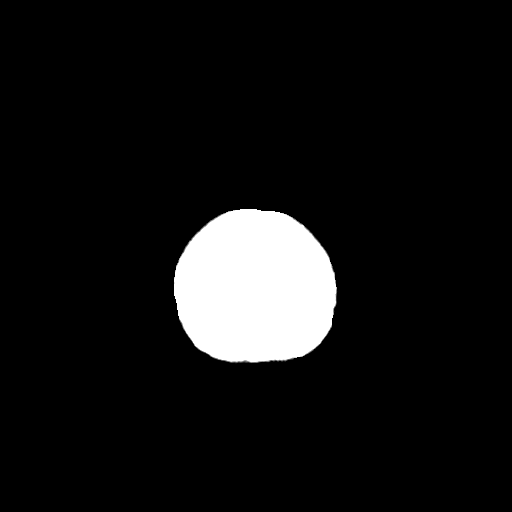

[Series 601: coronal brain · coronal · 0.49mm/px · 3 of 67 slices shown]
[im 23/67  brain]
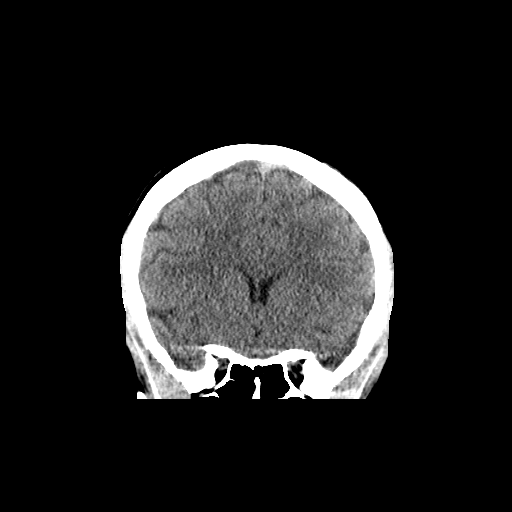
[im 30/67  brain]
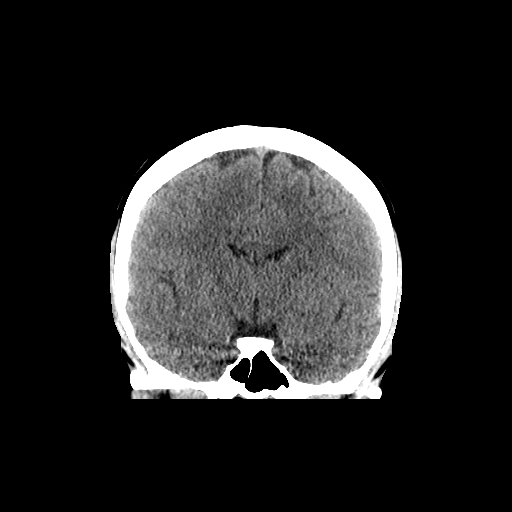
[im 37/67  brain]
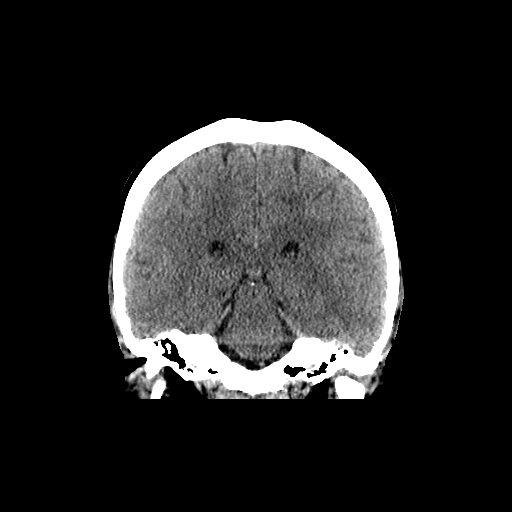

[Series 602: sagittal brain · sagittal · 0.49mm/px · 3 of 55 slices shown]
[im 19/55  brain]
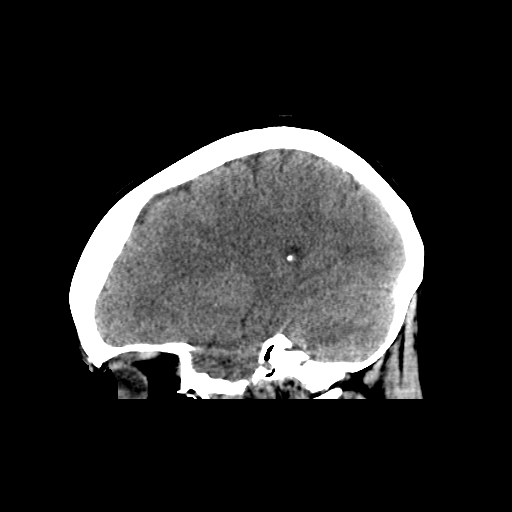
[im 28/55  brain]
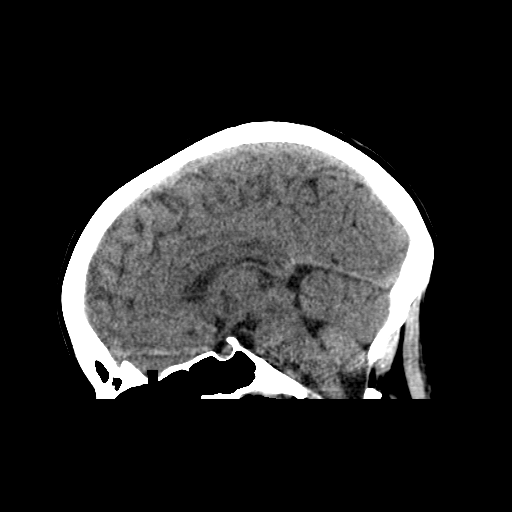
[im 37/55  brain]
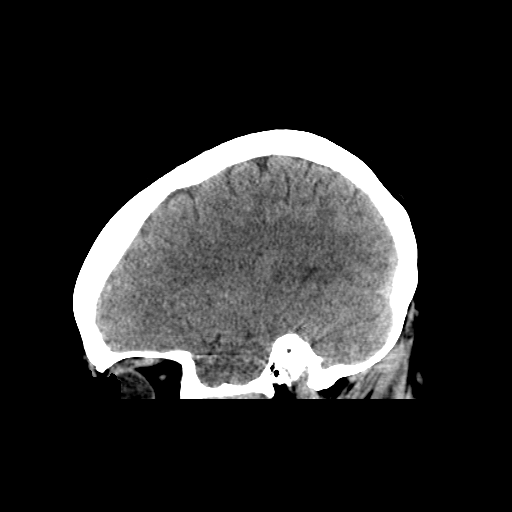

[16 of 47 positions shown; findings below may reference images not displayed]

FINDINGS: Brain: Cerebral volume is normal. No midline shift,
ventriculomegaly, mass effect, evidence of mass lesion, intracranial
hemorrhage or evidence of cortically based acute infarction.
Gray-white matter differentiation is within normal limits throughout
the brain.

Vascular: No suspicious intracranial vascular hyperdensity.

Skull: No osseous abnormality identified.

Sinuses/Orbits: Visualized paranasal sinuses and mastoids are well
pneumatized.

Other: Visualized orbits and scalp soft tissues are within normal
limits.
IMPRESSION: Normal noncontrast CT appearance of the brain.

## 2019-01-20 DIAGNOSIS — E663 Overweight: Secondary | ICD-10-CM | POA: Diagnosis not present

## 2019-01-20 DIAGNOSIS — Z0000 Encounter for general adult medical examination without abnormal findings: Secondary | ICD-10-CM | POA: Diagnosis not present

## 2019-01-20 DIAGNOSIS — R946 Abnormal results of thyroid function studies: Secondary | ICD-10-CM | POA: Diagnosis not present

## 2019-01-26 DIAGNOSIS — J3089 Other allergic rhinitis: Secondary | ICD-10-CM | POA: Diagnosis not present

## 2019-01-26 DIAGNOSIS — Z1331 Encounter for screening for depression: Secondary | ICD-10-CM | POA: Diagnosis not present

## 2019-01-26 DIAGNOSIS — Z0000 Encounter for general adult medical examination without abnormal findings: Secondary | ICD-10-CM | POA: Diagnosis not present

## 2019-01-26 DIAGNOSIS — R946 Abnormal results of thyroid function studies: Secondary | ICD-10-CM | POA: Diagnosis not present

## 2019-01-26 DIAGNOSIS — L719 Rosacea, unspecified: Secondary | ICD-10-CM | POA: Diagnosis not present

## 2019-01-26 DIAGNOSIS — E663 Overweight: Secondary | ICD-10-CM | POA: Diagnosis not present

## 2019-07-23 DIAGNOSIS — Z23 Encounter for immunization: Secondary | ICD-10-CM | POA: Diagnosis not present

## 2019-09-02 DIAGNOSIS — Z01419 Encounter for gynecological examination (general) (routine) without abnormal findings: Secondary | ICD-10-CM | POA: Diagnosis not present

## 2019-09-02 DIAGNOSIS — Z6835 Body mass index (BMI) 35.0-35.9, adult: Secondary | ICD-10-CM | POA: Diagnosis not present

## 2019-10-01 ENCOUNTER — Telehealth (HOSPITAL_COMMUNITY): Payer: Self-pay | Admitting: *Deleted

## 2019-10-01 DIAGNOSIS — M79662 Pain in left lower leg: Secondary | ICD-10-CM | POA: Diagnosis not present

## 2019-10-01 NOTE — Telephone Encounter (Signed)
The above patient or their representative was contacted and gave the following answers to these questions:         Do you have any of the following symptoms?    NO  Fever                    Cough                   Shortness of breath  Do  you have any of the following other symptoms?    muscle pain         vomiting,        diarrhea        rash         weakness        red eye        abdominal pain         bruising          bruising or bleeding              joint pain           severe headache    Have you been in contact with someone who was or has been sick in the past 2 weeks?  NO  Yes                 Unsure                         Unable to assess   Does the person that you were in contact with have any of the following symptoms?   Cough         shortness of breath           muscle pain         vomiting,            diarrhea            rash            weakness           fever            red eye           abdominal pain           bruising  or  bleeding                joint pain                severe headache                 COMMENTS OR ACTION PLAN FOR THIS PATIENT:     NO TO ALL PER DJ AT [facility]

## 2019-10-02 ENCOUNTER — Other Ambulatory Visit (HOSPITAL_COMMUNITY): Payer: Self-pay | Admitting: Family Medicine

## 2019-10-02 ENCOUNTER — Ambulatory Visit (HOSPITAL_COMMUNITY)
Admission: RE | Admit: 2019-10-02 | Discharge: 2019-10-02 | Disposition: A | Discharge status: Home / Self Care | Payer: BC Managed Care – PPO | Source: Ambulatory Visit | Attending: Family | Admitting: Family

## 2019-10-02 ENCOUNTER — Other Ambulatory Visit: Payer: Self-pay

## 2019-10-02 DIAGNOSIS — M79662 Pain in left lower leg: Secondary | ICD-10-CM | POA: Insufficient documentation

## 2019-12-28 ENCOUNTER — Other Ambulatory Visit: Payer: BC Managed Care – PPO

## 2019-12-28 ENCOUNTER — Other Ambulatory Visit: Payer: Self-pay

## 2022-11-19 DIAGNOSIS — Z01419 Encounter for gynecological examination (general) (routine) without abnormal findings: Secondary | ICD-10-CM | POA: Diagnosis not present

## 2022-11-19 DIAGNOSIS — Z6826 Body mass index (BMI) 26.0-26.9, adult: Secondary | ICD-10-CM | POA: Diagnosis not present

## 2022-11-29 DIAGNOSIS — D509 Iron deficiency anemia, unspecified: Secondary | ICD-10-CM | POA: Diagnosis not present

## 2022-11-30 DIAGNOSIS — R946 Abnormal results of thyroid function studies: Secondary | ICD-10-CM | POA: Diagnosis not present

## 2022-11-30 DIAGNOSIS — D509 Iron deficiency anemia, unspecified: Secondary | ICD-10-CM | POA: Diagnosis not present

## 2022-11-30 DIAGNOSIS — R7989 Other specified abnormal findings of blood chemistry: Secondary | ICD-10-CM | POA: Diagnosis not present

## 2022-12-06 DIAGNOSIS — Z0000 Encounter for general adult medical examination without abnormal findings: Secondary | ICD-10-CM | POA: Diagnosis not present

## 2022-12-06 DIAGNOSIS — Z1331 Encounter for screening for depression: Secondary | ICD-10-CM | POA: Diagnosis not present

## 2022-12-06 DIAGNOSIS — R82998 Other abnormal findings in urine: Secondary | ICD-10-CM | POA: Diagnosis not present

## 2023-11-14 DIAGNOSIS — Z1331 Encounter for screening for depression: Secondary | ICD-10-CM | POA: Diagnosis not present

## 2023-11-14 DIAGNOSIS — Z01419 Encounter for gynecological examination (general) (routine) without abnormal findings: Secondary | ICD-10-CM | POA: Diagnosis not present

## 2023-12-09 DIAGNOSIS — R519 Headache, unspecified: Secondary | ICD-10-CM | POA: Diagnosis not present

## 2023-12-09 DIAGNOSIS — Z0000 Encounter for general adult medical examination without abnormal findings: Secondary | ICD-10-CM | POA: Diagnosis not present

## 2023-12-09 DIAGNOSIS — D649 Anemia, unspecified: Secondary | ICD-10-CM | POA: Diagnosis not present

## 2023-12-09 DIAGNOSIS — R946 Abnormal results of thyroid function studies: Secondary | ICD-10-CM | POA: Diagnosis not present

## 2023-12-09 DIAGNOSIS — D509 Iron deficiency anemia, unspecified: Secondary | ICD-10-CM | POA: Diagnosis not present

## 2023-12-16 DIAGNOSIS — Z1389 Encounter for screening for other disorder: Secondary | ICD-10-CM | POA: Diagnosis not present

## 2023-12-16 DIAGNOSIS — Z1331 Encounter for screening for depression: Secondary | ICD-10-CM | POA: Diagnosis not present

## 2023-12-16 DIAGNOSIS — Z0000 Encounter for general adult medical examination without abnormal findings: Secondary | ICD-10-CM | POA: Diagnosis not present

## 2023-12-16 DIAGNOSIS — R82998 Other abnormal findings in urine: Secondary | ICD-10-CM | POA: Diagnosis not present

## 2023-12-22 ENCOUNTER — Ambulatory Visit: Payer: Self-pay

## 2024-06-25 DIAGNOSIS — S80812A Abrasion, left lower leg, initial encounter: Secondary | ICD-10-CM | POA: Diagnosis not present

## 2024-06-25 DIAGNOSIS — L089 Local infection of the skin and subcutaneous tissue, unspecified: Secondary | ICD-10-CM | POA: Diagnosis not present
